# Patient Record
Sex: Male | Born: 1950 | Race: Black or African American | Hispanic: No | Marital: Married | Smoking: Never smoker
Health system: Southern US, Community
[De-identification: ages and names within clinical notes are randomized; demographics above are authoritative.]

## PROBLEM LIST (undated history)

## (undated) DIAGNOSIS — I10 Essential (primary) hypertension: Secondary | ICD-10-CM

## (undated) DIAGNOSIS — M1990 Unspecified osteoarthritis, unspecified site: Secondary | ICD-10-CM

## (undated) DIAGNOSIS — R7303 Prediabetes: Secondary | ICD-10-CM

---

## 1988-07-13 HISTORY — PX: TONSILLECTOMY: SUR1361

## 2021-03-21 ENCOUNTER — Ambulatory Visit (INDEPENDENT_AMBULATORY_CARE_PROVIDER_SITE_OTHER): Payer: BC Managed Care – PPO | Admitting: Orthopaedic Surgery

## 2021-03-21 ENCOUNTER — Other Ambulatory Visit: Payer: Self-pay

## 2021-03-21 ENCOUNTER — Other Ambulatory Visit (HOSPITAL_BASED_OUTPATIENT_CLINIC_OR_DEPARTMENT_OTHER): Payer: Self-pay | Admitting: Orthopaedic Surgery

## 2021-03-21 ENCOUNTER — Ambulatory Visit (HOSPITAL_BASED_OUTPATIENT_CLINIC_OR_DEPARTMENT_OTHER)
Admission: RE | Admit: 2021-03-21 | Discharge: 2021-03-21 | Disposition: A | Discharge status: Home / Self Care | Payer: BC Managed Care – PPO | Source: Ambulatory Visit | Attending: Orthopaedic Surgery | Admitting: Orthopaedic Surgery

## 2021-03-21 ENCOUNTER — Ambulatory Visit (HOSPITAL_BASED_OUTPATIENT_CLINIC_OR_DEPARTMENT_OTHER): Payer: Self-pay | Admitting: Orthopaedic Surgery

## 2021-03-21 DIAGNOSIS — R52 Pain, unspecified: Secondary | ICD-10-CM | POA: Diagnosis present

## 2021-03-21 DIAGNOSIS — G8929 Other chronic pain: Secondary | ICD-10-CM | POA: Diagnosis present

## 2021-03-21 DIAGNOSIS — M12811 Other specific arthropathies, not elsewhere classified, right shoulder: Secondary | ICD-10-CM | POA: Diagnosis not present

## 2021-03-21 DIAGNOSIS — M25511 Pain in right shoulder: Secondary | ICD-10-CM

## 2021-03-21 NOTE — Progress Notes (Addendum)
Chief Complaint: Right shoulder pain     History of Present Illness:   Pain Score: 8/10 SANE: 10/100  Dwayne Wade is a 70 y.o. male with right shoulder pain now ongoing for greater than 5 years.  He states that the pain is occurred insidiously and is not associated with any specific injury or accident.  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  He has had multiple nonoperative treatments including anti-inflammatory medications.  He is on tramadol as well as oxycodone as result of his significant shoulder pain.  He attempts to be very active and work out although this is severely limited due to his shoulder pain and dysfunction.  He has had multiple injections but they estimate is 5 total injections which are no longer efficacious.  His last injection was earlier in 2022 which provided essentially no pain relief.  He is here today to discuss further treatment and evaluation.  At this time, he has failed greater than 6 weeks of conservative nonoperative treatments including NSAID medication, activity modification, and steroid injections.  He has previously trialed physical therapy although this did not significantly reduce his pain.    Surgical History:   None  PMH/PSH/Family History/Social History/Meds/Allergies:   No past medical history on file.  Social History   Socioeconomic History   Marital status: Married    Spouse name: Not on file   Number of children: Not on file   Years of education: Not on file   Highest education level: Not on file  Occupational History   Not on file  Tobacco Use   Smoking status: Not on file   Smokeless tobacco: Not on file  Substance and Sexual Activity   Alcohol use: Not on file   Drug use: Not on file   Sexual activity: Not on file  Other Topics Concern   Not on file  Social History Narrative   Not on file   Social Determinants of Health   Financial Resource Strain: Not on  file  Food Insecurity: Not on file  Transportation Needs: Not on file  Physical Activity: Not on file  Stress: Not on file  Social Connections: Not on file   No family history on file. Not on File No current outpatient medications on file.   No current facility-administered medications for this visit.   No results found.  Review of Systems:   A ROS was performed including pertinent positives and negatives as documented in the HPI.  Physical Exam :   Constitutional: NAD and appears stated age Neurological: Alert and oriented Psych: Appropriate affect and cooperative There were no vitals taken for this visit.   Comprehensive Musculoskeletal Exam:    Musculoskeletal Exam    Inspection Right Left  Skin No atrophy or winging No atrophy or winging  Palpation    Tenderness Glenohumeral None  Range of Motion    Flexion (passive) 60 170  Flexion (active) 180  Achiness 70-year-old male here with his 180  Extension 30 30  Abduction 20 170  ER at the side 0 70  ER at 90 of abduction Not tested 90  IR at 90 of abduction Not tested 60  Can reach behind back to Sacrum pain T10  Strength     4 out of 5 due to pain Normal  Special Tests    Pseudoparalytic No No  Neurologic    Fires PIN, radial, median, ulnar, musculocutaneous, axillary, suprascapular, long thoracic, and spinal accessory innervated muscles. No abnormal sensibility  Vascular/Lymphatic    Radial Pulse 2+ 2+  Cervical Exam    Patient has symmetric cervical range of motion with negative Spurling's test.  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   Assessment:   70 year old right-hand-dominant male-dominant male with advanced rotator cuff arthropathy.  At this time he has exhausted all nonoperative treatment modalities including shoe right  steroid shoulder injections and medical management.  He is wishing to pursue right shoulder arthroplasty in order to improve his pain and his function.  I have advised that given his age in the setting of humeral head migration that I believe a reverse shoulder arthroplasty would be the ideal procedure for him.  We will plan to perform a CT scan for preoperative planning.    At this time, he has failed greater than 6 weeks of conservative nonoperative treatments including NSAID medication, activity modification, and steroid injections.  He has previously trialed physical therapy although this did not significantly reduce his pain. Plan :    -CT scan right shoulder for preoperative assessment -Plan for right shoulder reverse arthroplasty on an elective basis    After a lengthy discussion of treatment options, including risks, benefits, alternatives, complications of surgical and nonsurgical conservative options, the patient elected surgical repair.   The patient  is aware of the material risks  and complications including, but not limited to injury to adjacent structures, neurovascular injury, infection, numbness, bleeding, implant failure, thermal burns, stiffness, persistent pain, failure to heal, disease transmission from allograft, need for further surgery, dislocation, anesthetic risks, blood clots, risks of death,and others. The probabilities of surgical success and failure discussed with patient given their particular co-morbidities.The time and nature of expected rehabilitation and recovery was discussed.The patient's questions were all answered preoperatively.  No barriers to understanding were noted. I explained the natural history of the disease process and Rx rationale.  I explained to the patient what I considered to be reasonable expectations given their personal situation.  The final treatment plan was arrived at through a shared patient decision making process model.  Patient was  prescribed a shoulder immobilizer today for the diagnosis listed above under assessment. The patient requires stabilization from this orthosis in their right arm.  This will be provided in the operating room     I personally saw and evaluated the patient, and participated in the management and treatment plan.  Huel Cote, MD Attending Physician, Orthopedic Surgery  This document was dictated using Dragon voice recognition software. A reasonable attempt at proof reading has been made to minimize errors.'  Is a patient is

## 2021-03-31 ENCOUNTER — Other Ambulatory Visit: Payer: Self-pay

## 2021-04-18 NOTE — Progress Notes (Signed)
Surgical Instructions    Your procedure is scheduled on 04/30/21.  Report to [facility] Main Entrance "A" at 10:15 A.M., then check in with the Admitting office.  Call this number if you have problems the morning of surgery:  [phone]   If you have any questions prior to your surgery date call [phone]: Open Monday-Friday 8am-4pm    Remember:  Do not eat after midnight the night before your surgery  You may drink clear liquids until 09:15am the morning of your surgery.   Clear liquids allowed are: Water, Non-Citrus Juices (without pulp), Carbonated Beverages, Clear Tea, Black Coffee ONLY (NO MILK, CREAM OR POWDERED CREAMER of any kind), and Gatorade  Patient Instructions  The night before surgery:  No food after midnight. ONLY clear liquids after midnight  The day of surgery (if you do NOT have diabetes):  Drink ONE (1) Pre-Surgery Clear Ensure by 09:15am the morning of surgery. Drink in one sitting. Do not sip.  This drink was given to you during your hospital  pre-op appointment visit. Nothing else to drink after completing the  Pre-Surgery Clear Ensure.           If you have questions, please contact your surgeon's office.     Take these medicines the morning of surgery with A SIP OF WATER  amLODipine (NORVASC)  PROLATE  traMADol (ULTRAM)   IF NEEDED: hydrOXYzine (ATARAX/VISTARIL) meclizine (ANTIVERT) montelukast (SINGULAIR)   As of today, STOP taking any Aspirin (unless otherwise instructed by your surgeon) Aleve, Naproxen, Ibuprofen, Motrin, Advil, Goody's, BC's, all herbal medications, fish oil, and all vitamins.     After your COVID test   You are not required to quarantine however you are required to wear a well-fitting mask when you are out and around people not in your household.  If your mask becomes wet or soiled, replace with a new one.  Wash your hands often with soap and water for 20 seconds or clean your hands with an alcohol-based hand  sanitizer that contains at least 60% alcohol.  Do not share personal items.  Notify your provider: if you are in close contact with someone who has COVID  or if you develop a fever of 100.4 or greater, sneezing, cough, sore throat, shortness of breath or body aches.             Do not wear jewelry or makeup Do not wear lotions, powders, perfumes/colognes, or deodorant. Men may shave face and neck. Do not bring valuables to the hospital.  DO Not wear nail polish, gel polish, artificial nails, or any other type of covering on natural nails including finger and toenails. If patients have artificial nails, gel coating, etc. that need to be removed by a nail salon, please have this removed prior to surgery or surgery may need to be canceled/delayed if the surgeon/ anesthesia feels like the patient is unable to be adequately monitored.             [facility] is not responsible for any belongings or valuables.  Do NOT Smoke (Tobacco/Vaping)  24 hours prior to your procedure  If you use a CPAP at night, you may bring your mask for your overnight stay.   Contacts, glasses, hearing aids, dentures or partials may not be worn into surgery, please bring cases for these belongings   For patients admitted to the hospital, discharge time will be determined by your treatment team.   Patients discharged the day of surgery will not be  allowed to drive home, and someone needs to stay with them for 24 hours.  NO VISITORS WILL BE ALLOWED IN PRE-OP WHERE PATIENTS ARE PREPPED FOR SURGERY.  ONLY 1 SUPPORT PERSON MAY BE PRESENT IN THE WAITING ROOM WHILE YOU ARE IN SURGERY.  IF YOU ARE TO BE ADMITTED, ONCE YOU ARE IN YOUR ROOM YOU WILL BE ALLOWED TWO (2) VISITORS. 1 (ONE) VISITOR MAY STAY OVERNIGHT BUT MUST ARRIVE TO THE ROOM BY 8pm.  Minor children may have two parents present. Special consideration for safety and communication needs will be reviewed on a case by case basis.  Special instructions:    Oral  Hygiene is also important to reduce your risk of infection.  Remember - BRUSH YOUR TEETH THE MORNING OF SURGERY WITH YOUR REGULAR TOOTHPASTE      [facility]- Preparing for Total Shoulder Arthroplasty  Before surgery, you can play an important role. Because skin is not sterile, your skin needs to be as free of germs as possible. You can reduce the number of germs on your skin by using the following products.   Benzoyl Peroxide Gel  o Reduces the number of germs present on the skin  o Applied twice a day to shoulder area starting two days before surgery   Chlorhexidine Gluconate (CHG) Soap (instructions listed above on how to wash with CHG Soap)  o An antiseptic cleaner that kills germs and bonds with the skin to continue killing germs even after washing  o Used for showering the night before surgery and morning of surgery   ==================================================================  Please follow these instructions carefully:  BENZOYL PEROXIDE 5% GEL  Please do not use if you have an allergy to benzoyl peroxide. If your skin becomes reddened/irritated stop using the benzoyl peroxide.  Starting two days before surgery, apply as follows:  1. Apply benzoyl peroxide in the morning and at night. Apply after taking a shower. If you are not taking a shower clean entire shoulder front, back, and side along with the armpit with a clean wet washcloth.  2. Place a quarter-sized dollop on your SHOULDER and rub in thoroughly, making sure to cover the front, back, and side of your shoulder, along with the armpit.   2 Days prior to Surgery First Dose on 04/28/21 Morning Second Dose on 04/28/21 Night  Day Before Surgery First Dose on 04/29/21 Morning Night before surgery wash (entire body except face and private areas) with CHG Soap THEN Second Dose on 04/29/21 Night   Morning of Surgery  wash BODY AGAIN with CHG Soap   4. Do NOT apply benzoyl peroxide gel on the day of  surgery   [facility]- Preparing For Surgery  Before surgery, you can play an important role. Because skin is not sterile, your skin needs to be as free of germs as possible. You can reduce the number of germs on your skin by washing with CHG (chlorahexidine gluconate) Soap before surgery.  CHG is an antiseptic cleaner which kills germs and bonds with the skin to continue killing germs even after washing.     Please do not use if you have an allergy to CHG or antibacterial soaps. If your skin becomes reddened/irritated stop using the CHG.  Do not shave (including legs and underarms) for at least 48 hours prior to first CHG shower. It is OK to shave your face.  Please follow these instructions carefully.     Shower the NIGHT BEFORE SURGERY and the MORNING OF SURGERY with CHG Soap.  If you chose to wash your hair, wash your hair first as usual with your normal shampoo. After you shampoo, rinse your hair and body thoroughly to remove the shampoo.  Then [employer] and genitals (private parts) with your normal soap and rinse thoroughly to remove soap.  After that Use CHG Soap as you would any other liquid soap. You can apply CHG directly to the skin and wash gently with a scrungie or a clean washcloth.   Apply the CHG Soap to your body ONLY FROM THE NECK DOWN.  Do not use on open wounds or open sores. Avoid contact with your eyes, ears, mouth and genitals (private parts). Wash Face and genitals (private parts)  with your normal soap.   Wash thoroughly, paying special attention to the area where your surgery will be performed.  Thoroughly rinse your body with warm water from the neck down.  DO NOT shower/wash with your normal soap after using and rinsing off the CHG Soap.  Pat yourself dry with a CLEAN TOWEL.  8. Apply the Benzoyl Peroxide only the night before surgery.  Do Not use it the morning of surgery.  Wear CLEAN PAJAMAS to bed the night before surgery  Place CLEAN SHEETS on your  bed the night before your surgery  DO NOT SLEEP WITH PETS.   Day of Surgery: Take a shower with CHG soap. Wear Clean/Comfortable clothing the morning of surgery Do not apply any deodorants/lotions.   Remember to brush your teeth WITH YOUR REGULAR TOOTHPASTE.   Please read over the following fact sheets that you were given.

## 2021-04-21 ENCOUNTER — Inpatient Hospital Stay (HOSPITAL_COMMUNITY)
Admission: RE | Admit: 2021-04-21 | Discharge: 2021-04-21 | Disposition: A | Discharge status: Home / Self Care | Payer: BC Managed Care – PPO | Source: Ambulatory Visit

## 2021-04-21 NOTE — Progress Notes (Signed)
Called patient at 14 and spoke to his wife.  I asked if he was coming to his 0800 appt?  She said is it to late to come now?  I said for the 8am appt yes but I have a 3pm appt time and that all the other appt times were full for me today.  I offered the 3pm to her twice and then saw that surgery was not until the 19th.  I then told her I would have our scheduler Dondra Spry call them.

## 2021-04-22 ENCOUNTER — Other Ambulatory Visit: Payer: Self-pay

## 2021-04-22 ENCOUNTER — Encounter (HOSPITAL_COMMUNITY)
Admission: RE | Admit: 2021-04-22 | Discharge: 2021-04-22 | Disposition: A | Discharge status: Home / Self Care | Payer: BC Managed Care – PPO | Source: Ambulatory Visit | Attending: Orthopaedic Surgery | Admitting: Orthopaedic Surgery

## 2021-04-22 ENCOUNTER — Encounter (HOSPITAL_COMMUNITY): Payer: Self-pay

## 2021-04-22 ENCOUNTER — Telehealth: Payer: Self-pay | Admitting: Orthopaedic Surgery

## 2021-04-22 DIAGNOSIS — Z01818 Encounter for other preprocedural examination: Secondary | ICD-10-CM | POA: Diagnosis present

## 2021-04-22 HISTORY — DX: Prediabetes: R73.03

## 2021-04-22 HISTORY — DX: Essential (primary) hypertension: I10

## 2021-04-22 HISTORY — DX: Unspecified osteoarthritis, unspecified site: M19.90

## 2021-04-22 LAB — CBC WITH DIFFERENTIAL/PLATELET
Abs Immature Granulocytes: 0.01 10*3/uL (ref 0.00–0.07)
Basophils Absolute: 0 10*3/uL (ref 0.0–0.1)
Basophils Relative: 1 %
Eosinophils Absolute: 0.3 10*3/uL (ref 0.0–0.5)
Eosinophils Relative: 6 %
HCT: 36.6 % — ABNORMAL LOW (ref 39.0–52.0)
Hemoglobin: 12.6 g/dL — ABNORMAL LOW (ref 13.0–17.0)
Immature Granulocytes: 0 %
Lymphocytes Relative: 23 %
Lymphs Abs: 1.2 10*3/uL (ref 0.7–4.0)
MCH: 30.9 pg (ref 26.0–34.0)
MCHC: 34.4 g/dL (ref 30.0–36.0)
MCV: 89.7 fL (ref 80.0–100.0)
Monocytes Absolute: 0.6 10*3/uL (ref 0.1–1.0)
Monocytes Relative: 13 %
Neutro Abs: 2.8 10*3/uL (ref 1.7–7.7)
Neutrophils Relative %: 57 %
Platelets: 200 10*3/uL (ref 150–400)
RBC: 4.08 MIL/uL — ABNORMAL LOW (ref 4.22–5.81)
RDW: 13.2 % (ref 11.5–15.5)
WBC: 4.9 10*3/uL (ref 4.0–10.5)
nRBC: 0 % (ref 0.0–0.2)

## 2021-04-22 LAB — BASIC METABOLIC PANEL
Anion gap: 9 (ref 5–15)
BUN: 22 mg/dL (ref 8–23)
CO2: 24 mmol/L (ref 22–32)
Calcium: 9.2 mg/dL (ref 8.9–10.3)
Chloride: 102 mmol/L (ref 98–111)
Creatinine, Ser: 1.52 mg/dL — ABNORMAL HIGH (ref 0.61–1.24)
GFR, Estimated: 49 mL/min — ABNORMAL LOW (ref >60)
Glucose, Bld: 105 mg/dL — ABNORMAL HIGH (ref 70–99)
Potassium: 3.5 mmol/L (ref 3.5–5.1)
Sodium: 135 mmol/L (ref 135–145)

## 2021-04-22 LAB — SURGICAL PCR SCREEN
MRSA, PCR: POSITIVE — AB
Staphylococcus aureus: POSITIVE — AB

## 2021-04-22 LAB — GLUCOSE, CAPILLARY: Glucose-Capillary: 104 mg/dL — ABNORMAL HIGH (ref 70–99)

## 2021-04-22 LAB — PROTIME-INR
INR: 0.9 (ref 0.8–1.2)
Prothrombin Time: 12.1 seconds (ref 11.4–15.2)

## 2021-04-22 NOTE — Progress Notes (Signed)
PCP - Chiquita Loth, PA Cardiologist - denies  PPM/ICD - denies   Chest x-ray - denies EKG - denies Stress Test - denies ECHO - denies Cardiac Cath - denies  Sleep Study - denies   DM- Pre-diabetic, pt does not check CBG at home  Blood Thinner Instructions: n/a Aspirin Instructions: n/a  ERAS Protcol - yes PRE-SURGERY Ensure given  COVID TEST- n/a, ambulatory surgery   Anesthesia review: No  Patient denies shortness of breath, fever, cough and chest pain at PAT appointment   All instructions explained to the patient, with a verbal understanding of the material. Patient agrees to go over the instructions while at home for a better understanding. The opportunity to ask questions was provided.

## 2021-04-22 NOTE — Telephone Encounter (Signed)
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   Patient is scheduled for right reverse shoulder arthroplasty with Dr. Steward Drone at [facility] 04-30-21 at 12:15pm.

## 2021-04-25 ENCOUNTER — Ambulatory Visit (HOSPITAL_BASED_OUTPATIENT_CLINIC_OR_DEPARTMENT_OTHER): Payer: BC Managed Care – PPO | Admitting: Orthopaedic Surgery

## 2021-04-29 NOTE — Anesthesia Preprocedure Evaluation (Addendum)
Anesthesia Evaluation  Patient identified by MRN, date of birth, ID band Patient awake    Reviewed: Allergy & Precautions, NPO status , Patient's Chart, lab work & pertinent test results  Airway Mallampati: II  TM Distance: >3 FB Neck ROM: Full    Dental no notable dental hx.    Pulmonary neg pulmonary ROS,    Pulmonary exam normal breath sounds clear to auscultation       Cardiovascular hypertension, Pt. on medications Normal cardiovascular exam Rhythm:Regular Rate:Normal  ECG: NSR, rate 66   Neuro/Psych negative neurological ROS  negative psych ROS   GI/Hepatic negative GI ROS, (+)     substance abuse  ,   Endo/Other  Pre-DM  Renal/GU Renal InsufficiencyRenal disease     Musculoskeletal  (+) Arthritis , narcotic dependent  Abdominal   Peds  Hematology  (+) anemia , HLD   Anesthesia Other Findings Right Rotator Cuff Arthropathy  Reproductive/Obstetrics                            Anesthesia Physical Anesthesia Plan  ASA: 2  Anesthesia Plan: General and Regional   Post-op Pain Management:    Induction: Intravenous  PONV Risk Score and Plan: 2 and Ondansetron, Dexamethasone and Treatment may vary due to age or medical condition  Airway Management Planned: Oral ETT  Additional Equipment:   Intra-op Plan:   Post-operative Plan: Extubation in OR  Informed Consent: I have reviewed the patients History and Physical, chart, labs and discussed the procedure including the risks, benefits and alternatives for the proposed anesthesia with the patient or authorized representative who has indicated his/her understanding and acceptance.     Dental advisory given  Plan Discussed with: CRNA  Anesthesia Plan Comments:        Anesthesia Quick Evaluation

## 2021-04-30 ENCOUNTER — Other Ambulatory Visit (HOSPITAL_BASED_OUTPATIENT_CLINIC_OR_DEPARTMENT_OTHER): Payer: Self-pay | Admitting: Orthopaedic Surgery

## 2021-04-30 ENCOUNTER — Ambulatory Visit (HOSPITAL_COMMUNITY): Payer: BC Managed Care – PPO | Admitting: Anesthesiology

## 2021-04-30 ENCOUNTER — Other Ambulatory Visit: Payer: Self-pay

## 2021-04-30 ENCOUNTER — Encounter (HOSPITAL_COMMUNITY)
Admission: RE | Disposition: A | Discharge status: Home / Self Care | Payer: Self-pay | Source: Home / Self Care | Attending: Orthopaedic Surgery

## 2021-04-30 ENCOUNTER — Ambulatory Visit (HOSPITAL_COMMUNITY)
Admission: RE | Admit: 2021-04-30 | Discharge: 2021-04-30 | Disposition: A | Discharge status: Home / Self Care | Payer: BC Managed Care – PPO | Attending: Orthopaedic Surgery | Admitting: Orthopaedic Surgery

## 2021-04-30 ENCOUNTER — Encounter (HOSPITAL_COMMUNITY): Payer: Self-pay | Admitting: Orthopaedic Surgery

## 2021-04-30 ENCOUNTER — Ambulatory Visit (HOSPITAL_COMMUNITY): Payer: BC Managed Care – PPO

## 2021-04-30 DIAGNOSIS — M25711 Osteophyte, right shoulder: Secondary | ICD-10-CM | POA: Diagnosis not present

## 2021-04-30 DIAGNOSIS — M19011 Primary osteoarthritis, right shoulder: Secondary | ICD-10-CM | POA: Diagnosis not present

## 2021-04-30 DIAGNOSIS — Z96612 Presence of left artificial shoulder joint: Secondary | ICD-10-CM

## 2021-04-30 DIAGNOSIS — M25511 Pain in right shoulder: Secondary | ICD-10-CM | POA: Diagnosis present

## 2021-04-30 DIAGNOSIS — M12811 Other specific arthropathies, not elsewhere classified, right shoulder: Secondary | ICD-10-CM

## 2021-04-30 DIAGNOSIS — M129 Arthropathy, unspecified: Secondary | ICD-10-CM | POA: Diagnosis not present

## 2021-04-30 DIAGNOSIS — Z79891 Long term (current) use of opiate analgesic: Secondary | ICD-10-CM | POA: Insufficient documentation

## 2021-04-30 DIAGNOSIS — M7521 Bicipital tendinitis, right shoulder: Secondary | ICD-10-CM | POA: Insufficient documentation

## 2021-04-30 HISTORY — PX: REVERSE SHOULDER ARTHROPLASTY: SHX5054

## 2021-04-30 LAB — GLUCOSE, CAPILLARY
Glucose-Capillary: 176 mg/dL — ABNORMAL HIGH (ref 70–99)
Glucose-Capillary: 108 mg/dL — ABNORMAL HIGH (ref 70–99)

## 2021-04-30 SURGERY — ARTHROPLASTY, SHOULDER, TOTAL, REVERSE
Anesthesia: Regional | Site: Shoulder | Laterality: Right

## 2021-04-30 MED ORDER — DEXAMETHASONE SODIUM PHOSPHATE 10 MG/ML IJ SOLN
INTRAMUSCULAR | Status: DC | PRN
  Administered 2021-04-30: 8 mg via INTRAVENOUS

## 2021-04-30 MED ORDER — ROCURONIUM BROMIDE 10 MG/ML (PF) SYRINGE
PREFILLED_SYRINGE | INTRAVENOUS | Status: DC | PRN
  Administered 2021-04-30: 30 mg via INTRAVENOUS
  Administered 2021-04-30: 10 mg via INTRAVENOUS
  Administered 2021-04-30: 60 mg via INTRAVENOUS

## 2021-04-30 MED ORDER — ROCURONIUM BROMIDE 10 MG/ML (PF) SYRINGE
PREFILLED_SYRINGE | INTRAVENOUS | Status: AC
  Filled 2021-04-30: qty 10

## 2021-04-30 MED ORDER — ONDANSETRON HCL 4 MG/2ML IJ SOLN
INTRAMUSCULAR | Status: DC | PRN
  Administered 2021-04-30: 4 mg via INTRAVENOUS

## 2021-04-30 MED ORDER — ONDANSETRON HCL 4 MG/2ML IJ SOLN: 4.0000 mg | Freq: Once | INTRAMUSCULAR | Status: DC | PRN

## 2021-04-30 MED ORDER — ASPIRIN EC 325 MG PO TBEC: 325.0000 mg | DELAYED_RELEASE_TABLET | Freq: Every day | ORAL | 0 refills | Status: AC

## 2021-04-30 MED ORDER — EPHEDRINE 5 MG/ML INJ
INTRAVENOUS | Status: AC
  Filled 2021-04-30: qty 5

## 2021-04-30 MED ORDER — LACTATED RINGERS IV SOLN: INTRAVENOUS | Status: DC

## 2021-04-30 MED ORDER — LIDOCAINE 2% (20 MG/ML) 5 ML SYRINGE
INTRAMUSCULAR | Status: AC
  Filled 2021-04-30: qty 5

## 2021-04-30 MED ORDER — DEXAMETHASONE SODIUM PHOSPHATE 10 MG/ML IJ SOLN
INTRAMUSCULAR | Status: AC
  Filled 2021-04-30: qty 1

## 2021-04-30 MED ORDER — FENTANYL CITRATE (PF) 100 MCG/2ML IJ SOLN: 25.0000 ug | INTRAMUSCULAR | Status: DC | PRN

## 2021-04-30 MED ORDER — OXYCODONE HCL 5 MG PO CAPS: 5.0000 mg | ORAL_CAPSULE | ORAL | 0 refills | Status: AC | PRN

## 2021-04-30 MED ORDER — CHLORHEXIDINE GLUCONATE 0.12 % MT SOLN
15.0000 mL | Freq: Once | OROMUCOSAL | Status: AC
  Administered 2021-04-30: 15 mL via OROMUCOSAL
  Filled 2021-04-30: qty 15

## 2021-04-30 MED ORDER — ACETAMINOPHEN 500 MG PO TABS
1000.0000 mg | ORAL_TABLET | Freq: Once | ORAL | Status: AC
  Administered 2021-04-30: 1000 mg via ORAL
  Filled 2021-04-30: qty 2

## 2021-04-30 MED ORDER — AMISULPRIDE (ANTIEMETIC) 5 MG/2ML IV SOLN: 5.0000 mg | Freq: Once | INTRAVENOUS | Status: DC | PRN

## 2021-04-30 MED ORDER — FENTANYL CITRATE (PF) 250 MCG/5ML IJ SOLN
INTRAMUSCULAR | Status: AC
  Filled 2021-04-30: qty 5

## 2021-04-30 MED ORDER — DEXMEDETOMIDINE (PRECEDEX) IN NS 20 MCG/5ML (4 MCG/ML) IV SYRINGE
PREFILLED_SYRINGE | INTRAVENOUS | Status: AC
  Filled 2021-04-30: qty 5

## 2021-04-30 MED ORDER — ACETAMINOPHEN 500 MG PO TABS: 500.0000 mg | ORAL_TABLET | Freq: Three times a day (TID) | ORAL | 0 refills | Status: AC

## 2021-04-30 MED ORDER — VANCOMYCIN HCL IN DEXTROSE 1-5 GM/200ML-% IV SOLN
1000.0000 mg | INTRAVENOUS | Status: AC
  Administered 2021-04-30: 1000 mg via INTRAVENOUS
  Filled 2021-04-30: qty 200

## 2021-04-30 MED ORDER — MIDAZOLAM HCL 2 MG/2ML IJ SOLN
INTRAMUSCULAR | Status: AC
  Filled 2021-04-30: qty 2

## 2021-04-30 MED ORDER — TRANEXAMIC ACID-NACL 1000-0.7 MG/100ML-% IV SOLN
1000.0000 mg | INTRAVENOUS | Status: AC
  Administered 2021-04-30: 1000 mg via INTRAVENOUS
  Filled 2021-04-30: qty 100

## 2021-04-30 MED ORDER — ONDANSETRON HCL 4 MG/2ML IJ SOLN
INTRAMUSCULAR | Status: AC
  Filled 2021-04-30: qty 2

## 2021-04-30 MED ORDER — GABAPENTIN 300 MG PO CAPS
300.0000 mg | ORAL_CAPSULE | Freq: Once | ORAL | Status: AC
  Administered 2021-04-30: 300 mg via ORAL
  Filled 2021-04-30: qty 1

## 2021-04-30 MED ORDER — 0.9 % SODIUM CHLORIDE (POUR BTL) OPTIME
TOPICAL | Status: DC | PRN
  Administered 2021-04-30: 1000 mL

## 2021-04-30 MED ORDER — BUPIVACAINE LIPOSOME 1.3 % IJ SUSP
INTRAMUSCULAR | Status: DC | PRN
  Administered 2021-04-30: 10 mL via PERINEURAL

## 2021-04-30 MED ORDER — PROPOFOL 10 MG/ML IV BOLUS
INTRAVENOUS | Status: AC
  Filled 2021-04-30: qty 20

## 2021-04-30 MED ORDER — PROPOFOL 10 MG/ML IV BOLUS
INTRAVENOUS | Status: AC
  Filled 2021-04-30: qty 40

## 2021-04-30 MED ORDER — ORAL CARE MOUTH RINSE: 15.0000 mL | Freq: Once | OROMUCOSAL | Status: AC

## 2021-04-30 MED ORDER — LIDOCAINE 2% (20 MG/ML) 5 ML SYRINGE
INTRAMUSCULAR | Status: DC | PRN
  Administered 2021-04-30: 40 mg via INTRAVENOUS

## 2021-04-30 MED ORDER — PROPOFOL 10 MG/ML IV BOLUS
INTRAVENOUS | Status: DC | PRN
  Administered 2021-04-30: 100 mg via INTRAVENOUS

## 2021-04-30 MED ORDER — CEFAZOLIN SODIUM-DEXTROSE 2-4 GM/100ML-% IV SOLN
2.0000 g | INTRAVENOUS | Status: AC
  Administered 2021-04-30: 2 g via INTRAVENOUS
  Filled 2021-04-30: qty 100

## 2021-04-30 MED ORDER — VANCOMYCIN HCL 500 MG IV SOLR
INTRAVENOUS | Status: DC | PRN
  Administered 2021-04-30: 500 mg via TOPICAL

## 2021-04-30 MED ORDER — STERILE WATER FOR IRRIGATION IR SOLN
Status: DC | PRN
  Administered 2021-04-30: 2000 mL

## 2021-04-30 MED ORDER — VANCOMYCIN HCL 500 MG IV SOLR
INTRAVENOUS | Status: AC
  Filled 2021-04-30: qty 10

## 2021-04-30 MED ORDER — FENTANYL CITRATE (PF) 250 MCG/5ML IJ SOLN
INTRAMUSCULAR | Status: DC | PRN
  Administered 2021-04-30: 100 ug via INTRAVENOUS

## 2021-04-30 MED ORDER — SODIUM CHLORIDE 0.9 % IR SOLN
Status: DC | PRN
  Administered 2021-04-30: 3000 mL

## 2021-04-30 MED ORDER — PHENYLEPHRINE HCL-NACL 20-0.9 MG/250ML-% IV SOLN
INTRAVENOUS | Status: DC | PRN
  Administered 2021-04-30: 40 ug/min via INTRAVENOUS

## 2021-04-30 MED ORDER — EPHEDRINE SULFATE-NACL 50-0.9 MG/10ML-% IV SOSY
PREFILLED_SYRINGE | INTRAVENOUS | Status: DC | PRN
  Administered 2021-04-30 (×3): 5 mg via INTRAVENOUS

## 2021-04-30 MED ORDER — BUPIVACAINE HCL (PF) 0.5 % IJ SOLN
INTRAMUSCULAR | Status: DC | PRN
  Administered 2021-04-30: 15 mL via PERINEURAL

## 2021-04-30 MED ORDER — SUGAMMADEX SODIUM 200 MG/2ML IV SOLN
INTRAVENOUS | Status: DC | PRN
  Administered 2021-04-30: 308 mg via INTRAVENOUS

## 2021-04-30 SURGICAL SUPPLY — 84 items
APL PRP STRL LF DISP 70% ISPRP (MISCELLANEOUS) ×1
APL SKNCLS STERI-STRIP NONHPOA (GAUZE/BANDAGES/DRESSINGS)
BAG COUNTER SPONGE SURGICOUNT (BAG) ×3 IMPLANT
BAG SPNG CNTER NS LX DISP (BAG) ×1
BENZOIN TINCTURE PRP APPL 2/3 (GAUZE/BANDAGES/DRESSINGS) ×2 IMPLANT
BIT DRILL ERGO 3.2 STRL (BIT) ×1 IMPLANT
BIT DRILL ERGO GPS RS 3.2 (DRILL) IMPLANT
BLADE SAW SAG 29X58X.64 (BLADE) ×3 IMPLANT
BLADE SAW SGTL 83.5X18.5 (BLADE) IMPLANT
BLADE SURG 15 STRL LF DISP TIS (BLADE) ×4 IMPLANT
BLADE SURG 15 STRL SS (BLADE) ×6
CAP LOCK EXT GLENOID RS 38 +4 (Cap) ×1 IMPLANT
CHLORAPREP W/TINT 26 (MISCELLANEOUS) ×3 IMPLANT
COVER BACK TABLE 24X17X13 BIG (DRAPES) ×3 IMPLANT
COVER SURGICAL LIGHT HANDLE (MISCELLANEOUS) ×3 IMPLANT
DRAPE IMP U-DRAPE 54X76 (DRAPES) ×3 IMPLANT
DRAPE INCISE IOBAN 66X45 STRL (DRAPES) ×3 IMPLANT
DRAPE ORTHO SPLIT 77X108 STRL (DRAPES) ×4
DRAPE STERI 35X30 U-POUCH (DRAPES) ×3 IMPLANT
DRAPE SURG ORHT 6 SPLT 77X108 (DRAPES) ×4 IMPLANT
DRAPE U-SHAPE 47X51 STRL (DRAPES) ×4 IMPLANT
DRILL ERGO GPS RS 3.2 (DRILL) ×2
DRSG AQUACEL AG ADV 3.5X10 (GAUZE/BANDAGES/DRESSINGS) ×1 IMPLANT
DRSG PAD ABDOMINAL 8X10 ST (GAUZE/BANDAGES/DRESSINGS) ×2 IMPLANT
ELECT REM PT RETURN 9FT ADLT (ELECTROSURGICAL) ×2
ELECTRODE REM PT RTRN 9FT ADLT (ELECTROSURGICAL) ×2 IMPLANT
GAUZE SPONGE 4X4 12PLY STRL (GAUZE/BANDAGES/DRESSINGS) ×2 IMPLANT
GAUZE XEROFORM 1X8 LF (GAUZE/BANDAGES/DRESSINGS) ×2 IMPLANT
GLENOSPHERE RSA EQUINOXE W/CAP (Joint) ×1 IMPLANT
GLOVE SRG 8 PF TXTR STRL LF DI (GLOVE) ×2 IMPLANT
GLOVE SURG LTX SZ8 (GLOVE) ×3 IMPLANT
GLOVE SURG POLY ORTHO LF SZ8 (GLOVE) ×3 IMPLANT
GLOVE SURG POLYISO LF SZ7.5 (GLOVE) ×3 IMPLANT
GLOVE SURG UNDER POLY LF SZ8 (GLOVE) ×2
GOWN STRL REUS W/ TWL LRG LVL3 (GOWN DISPOSABLE) ×4 IMPLANT
GOWN STRL REUS W/TWL LRG LVL3 (GOWN DISPOSABLE) ×4
HANDPIECE INTERPULSE COAX TIP (DISPOSABLE) ×2
K-WIRE ERGO 3.2 STRL (WIRE) ×2
K-WIRE SURGICAL 1.6X102 (WIRE) ×1 IMPLANT
KIT BASIN OR (CUSTOM PROCEDURE TRAY) ×3 IMPLANT
KIT GPS RS EQUIN USER (KITS) ×1 IMPLANT
KIT TURNOVER KIT B (KITS) ×3 IMPLANT
KWIRE ERGO 3.2 STRL (WIRE) IMPLANT
LINER HUM RS EQUIN 38 +0 (Liner) ×2 IMPLANT
MANIFOLD NEPTUNE II (INSTRUMENTS) ×3 IMPLANT
NDL HYPO 25GX1X1/2 BEV (NEEDLE) ×2 IMPLANT
NDL SUT 6 .5 CRC .975X.05 MAYO (NEEDLE) IMPLANT
NEEDLE HYPO 25GX1X1/2 BEV (NEEDLE) IMPLANT
NEEDLE MAYO TAPER (NEEDLE) ×2
NS IRRIG 1000ML POUR BTL (IV SOLUTION) ×3 IMPLANT
PACK SHOULDER (CUSTOM PROCEDURE TRAY) ×3 IMPLANT
PACK UNIVERSAL I (CUSTOM PROCEDURE TRAY) ×3 IMPLANT
PAD ARMBOARD 7.5X6 YLW CONV (MISCELLANEOUS) ×6 IMPLANT
PIN HEX THRD SHLD GPS +10 (PIN) ×1 IMPLANT
PIN THRD ERGO 2.6 2-PK (PIN) ×1 IMPLANT
PLATE GLENOID SUP RS 10D (Plate) ×1 IMPLANT
PROS ADAPTER HUM RS EQUIN +0 (Orthopedic Implant) ×2 IMPLANT
PROSTHESIS ADPTR HUM RS EQN +0 (Orthopedic Implant) IMPLANT
SCREW COMPR W/LOCK CAP 4.5X26 (Screw) ×1 IMPLANT
SCREW COMPR W/LOCK CAP 4.5X30 (Screw) ×1 IMPLANT
SCREW LOCK COMPR EQ 4.5X22 (Screw) ×2 IMPLANT
SCREW LOCK GLENOID 15-05 (Screw) ×1 IMPLANT
SCREW SHLD RS EQUIN (KITS) ×1 IMPLANT
SET HNDPC FAN SPRY TIP SCT (DISPOSABLE) IMPLANT
SPONGE T-LAP 4X18 ~~LOC~~+RFID (SPONGE) ×6 IMPLANT
STAPLER VISISTAT 35W (STAPLE) ×3 IMPLANT
STEM HUM RS EQUIN 9 (Stem) ×1 IMPLANT
SUCTION FRAZIER HANDLE 10FR (MISCELLANEOUS) ×2
SUCTION TUBE FRAZIER 10FR DISP (MISCELLANEOUS) ×2 IMPLANT
SUT MNCRL AB 3-0 PS2 27 (SUTURE) ×1 IMPLANT
SUT VIC AB 0 CT1 27 (SUTURE) ×2
SUT VIC AB 0 CT1 27XBRD ANBCTR (SUTURE) ×2 IMPLANT
SUT VIC AB 0 CTX 36 (SUTURE) ×4
SUT VIC AB 0 CTX36XBRD ANBCTRL (SUTURE) IMPLANT
SUT VIC AB 0 CTXB 36 (SUTURE) ×3 IMPLANT
SUT VIC AB 2-0 CT1 27 (SUTURE) ×2
SUT VIC AB 2-0 CT1 TAPERPNT 27 (SUTURE) IMPLANT
SYR CONTROL 10ML LL (SYRINGE) ×2 IMPLANT
TOWEL GREEN STERILE (TOWEL DISPOSABLE) ×3 IMPLANT
TOWEL GREEN STERILE FF (TOWEL DISPOSABLE) ×3 IMPLANT
TOWER CARTRIDGE SMART MIX (DISPOSABLE) IMPLANT
TRAY FOLEY MTR SLVR 16FR STAT (SET/KITS/TRAYS/PACK) IMPLANT
WATER STERILE IRR 1000ML POUR (IV SOLUTION) ×3 IMPLANT
YANKAUER SUCT BULB TIP NO VENT (SUCTIONS) ×1 IMPLANT

## 2021-04-30 NOTE — Interval H&P Note (Signed)
History and Physical Interval Note:  04/30/2021 8:04 AM  Dwayne Wade  has presented today for surgery, with the diagnosis of Right Rotator Cuff Arthropathy.  The various methods of treatment have been discussed with the patient and family. After consideration of risks, benefits and other options for treatment, the patient has consented to  Procedure(s): RIGHT REVERSE SHOULDER ARTHROPLASTY (Right) as a surgical intervention.  The patient's history has been reviewed, patient examined, no change in status, stable for surgery.  I have reviewed the patient's chart and labs.  Questions were answered to the patient's satisfaction.     Huel Cote

## 2021-04-30 NOTE — Op Note (Signed)
Date of Surgery: 04/30/2021  INDICATIONS: Mr. Bottger is a 70 y.o.-year-old male with right end stage rotator cuff arthropathy.  The risk and benefits of the procedure with discussed in detail and documented in the pre-operative evaluation.  PREOPERATIVE DIAGNOSIS: 1.  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  Right biceps tendinitis  POSTOPERATIVE DIAGNOSIS: Same.  PROCEDURE: Right reverse shoulder arthroplasty.  SURGEON: Yevonne Pax MD  ASSISTANT: Shirley Friar, ATC; necessary for the timely completion of procedure and due to complexity of procedure.  ANESTHESIA:  general  IV FLUIDS AND URINE: See anesthesia record.  ANTIBIOTICS: Ancef 2 g  ESTIMATED BLOOD LOSS: 25 mL.  IMPLANTS:  Implant Name Type Inv. Item Serial No. Manufacturer Lot No. LRB No. Used Action  Equinox Reverse Shoulder Fixed Angle Torque Defining Screw Kit Kit  [id number] EXACTECH  Right 1 Implanted  Reverse Shoulder Superior Augment Glenoid Plate 10"   [id number] EXACTECH  Right 1 Implanted  Glenosphere Locking Screw   [id number] EXACTECH  Right 1 Implanted  SCREW LOCK COMPR EQ 4.5X22 - [id number] Screw SCREW LOCK COMPR EQ 4.5X22 [id number] EXACTECH  Right 1 Implanted  COMPRESSION SCREW/LOCKING CAP KIT 4.5 X 30 MM/BLUE   [id number] [facility]  Right 1 Implanted  SCREW LOCK COMPR EQ 4.5X22 - [id number] Screw SCREW LOCK COMPR EQ 4.5X22 [id number] EXACTECH  Right 1 Implanted  COMPRESSION SCREW/LOCKING CAP KIT 4.5 X 26 MM/ORANGE   [id number] EXACTECH  Right 1 Implanted  GLENOSPHERE RSA EQUINOXE W/CAP - [id number] Joint GLENOSPHERE RSA EQUINOXE W/CAP [id number] EXACTECH  Right 1 Implanted  GLENOSPHERE & EXTENDED LOCKING CAPS(2) 38MM +4MM OFFSET   [id number] EXACTECH  Right 1 Implanted  HUMERAL STEM PRESS-FIT, PLASMA COATED   [id number] EXACTECH  Right 1 Implanted  EQUINOX REVERSE SHOULDER HUMERAL ADAPTER TRAAY   [id number]   Right 1 Implanted  EQUINOX REVERSE SHOULDER HUMERAL LINER   [id number]   Right 1 Implanted     DRAINS: None  CULTURES: None  COMPLICATIONS: none  DESCRIPTION OF PROCEDURE:  The patient was met in the preoperative holding area.  The patient was identified by her wrist bracelet.   The surgical site was marked by the surgeon.   Once again, the procedure and his postoperative rehabilitation were discussed with the patient and significant others.  Their questions were all answered   The patient was administered inter-scalene block by Anesthesia.  Patient was then taken to the operating room where he was placed in a beach chair position, head and extremities carefully padded.  Spider arm holder was utilized. MAC anesthesia used.   The surgical site was scrubbed with a chlorhexidine scrub brush and alcohol.  The patient was then prepped with chlorhexidine skin prep.Sterile prep and drape were performed.   An pre-operative timeout was called.   The pre-operative antibiotics had been administered prior to skin incision.   The bony landmarks of the shoulder were marked with a marking pen. A delto-pectoral incision was made, extending up approximately 5 inches. The wound with then irrigated with dilute betadine.  Cephalic vein was identified, and an protected. His retracted medially. Subdeltoid and subpectoral lesions were released. Neurovascular structures were carefully protected. The Gelpi retractor was used to retract the deltoid and pectoralis major. A 1 cm release was performed on the upper pectoralis.   The deltoid was retracted laterally with a Brown humeral retractor.  The conjoined tendon was identified.  The cleido-pectoral fascia was excised.  The axillary nerve was palpated and carefully protected throughout the  procedure. The biceps tendon was found and tenodesed to the upper pec with # 2 Vicryl.  Proximally the biceps tendon was removed up to the joint.  The lesser tuberosity subscapularis was tagged with an 0 Vicryl and subsequently peeled from the lesser tuberosity.   Circumferential release around the humeral neck was performed.  There was some osteophyte in the notes.  Areas that were removed with rondure.  There was a very minor amount of infraspinatus that was left intact.  At this point the humeral head was delivered using a lean maneuver.  A neck cut was then made using the pretemplating.  We subsequently broached up to the pretty templated humeral broach size.  The broach protector was then placed.  The humeral head bone was prepared in the back table and subsequently used for the central peg hole of the baseplate.   A 360 Degree release of the subscap and glenoid were done.  Of note due to the chronic pseudoparalysis and loss of motion, there was a significant amount of humeral head release that was needed as well as glenoid release.  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   Glenoid retractors were placed posteriorly, superiorly behind the biceps tendon and anteriorly on the glenoid neck.. A 360-degree release of the capsule was performed with cautery.  The triceps was released off the inferior tubercle of the glenoid. The axillary nerve was carefully protected with the surgeon's index finger, retracting it and using cautery.   A guidepin was placed through the glenoid guide. The guidepin was drilled until it exited the cortex. The guidepin was over drilled. Next, the glenoid was prepared with the reamer  down to cortical bone.  The central peg hole was totally within the scapular neck tested with the probe.  The baseplate was then placed screwed securely with good purchase in position and then with 4 screws. In each case, they were drilled and measured and the appropriate length screw placed with excellent rigid fixation of the baseplate.   At this time the humerus was then redelivered.  The humeral stem was removed.  Thorough irrigation was performed.  We placed the final stem and assembled the  baseplate.  Trial was performed on not a formal reduction performed.  This was just about to be reduced so a full reduction was not performed.  A final polywas impacted into place and the shoulder was reduced.    Appropriate tension was noted on the conjoined tendon and deltoid muscle.  Extension was stable, external and internal rotation as well.    The wound was then irrigated with SurgiLav.  Betadine was added to the irrigation for prevention of infection. Vancomycin powder was placed in the wound again for infection prevention.  Hemostasis was judged to be quite good.  The axillary nerve nerve was checked with a tug test and palpated and was intact.  The wound was then closed in layers with 0 Vicryl interrupted in the deep subcu followed by 2-0 Vicryl in the superficial subcu.  Skin clips were used to close the skin.  Dressing: Aquacel dressing was applied A shoulder immobilizer was applied.     Shirley Friar ATC was necessary for opening, closing, retracting, limb positioning and overall facilitation and timely completion of the procedure.     POSTOPERATIVE PLAN: The patient will be weightbearing as tolerated on the right side and come out of the sling for range of motion immediately with PT. He will sleep  in his sling.  Yevonne Pax, MD 12:57 PM

## 2021-04-30 NOTE — Transfer of Care (Signed)
Immediate Anesthesia Transfer of Care Note  Patient: [name]  Procedure(s) Performed: RIGHT REVERSE SHOULDER ARTHROPLASTY (Right: Shoulder)  Patient Location: PACU  Anesthesia Type:General  Level of Consciousness: sedated  Airway & Oxygen Therapy: Patient Spontanous Breathing and Patient connected to nasal cannula oxygen  Post-op Assessment: Report given to RN and Post -op Vital signs reviewed and stable  Post vital signs: Reviewed and stable  Last Vitals:  Vitals Value Taken Time  BP 122/86 04/30/21 1300  Temp    Pulse 70 04/30/21 1301  Resp 16 04/30/21 1301  SpO2 100 % 04/30/21 1301  Vitals shown include unvalidated device data.  Last Pain:  Vitals:   04/30/21 0708  TempSrc:   PainSc: 6       Patients Stated Pain Goal: 3 (04/30/21 0708)  Complications: No notable events documented.

## 2021-04-30 NOTE — Anesthesia Postprocedure Evaluation (Signed)
Anesthesia Post Note  Patient: Dwayne Wade  Procedure(s) Performed: RIGHT REVERSE SHOULDER ARTHROPLASTY (Right: Shoulder)     Patient location during evaluation: PACU Anesthesia Type: Regional and General Level of consciousness: awake Pain management: pain level controlled Vital Signs Assessment: post-procedure vital signs reviewed and stable Respiratory status: spontaneous breathing, nonlabored ventilation, respiratory function stable and patient connected to nasal cannula oxygen Cardiovascular status: blood pressure returned to baseline and stable Postop Assessment: no apparent nausea or vomiting Anesthetic complications: no   No notable events documented.  Last Vitals:  Vitals:   04/30/21 1345 04/30/21 1400  BP: 118/65 122/75  Pulse: 70 63  Resp: 18 16  Temp:  36.6 C  SpO2: 99% 98%    Last Pain:  Vitals:   04/30/21 1400  TempSrc:   PainSc: 0-No pain                 Misk Galentine P Makailyn Mccormick

## 2021-04-30 NOTE — H&P (Signed)
Chief Complaint: Right shoulder pain        History of Present Illness:    Pain Score: 8/10 SANE: 10/100   Dwayne Wade is a 70 y.o. male with right shoulder pain now ongoing for greater than 5 years.  He states that the pain is occurred insidiously and is not associated with any specific injury or accident.  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  He has had multiple nonoperative treatments including anti-inflammatory medications.  He is on tramadol as well as oxycodone as result of his significant shoulder pain.  He attempts to be very active and work out although this is severely limited due to his shoulder pain and dysfunction.  He has had multiple injections but they estimate is 5 total injections which are no longer efficacious.  His last injection was earlier in 2022 which provided essentially no pain relief.  He is here today to discuss further treatment and evaluation.   At this time, he has failed greater than 6 weeks of conservative nonoperative treatments including NSAID medication, activity modification, and steroid injections.  He has previously trialed physical therapy although this did not significantly reduce his pain.       Surgical History:   None   PMH/PSH/Family History/Social History/Meds/Allergies:   No past medical history on file.   Social History         Socioeconomic History   Marital status: Married      Spouse name: Not on file   Number of children: Not on file   Years of education: Not on file   Highest education level: Not on file  Occupational History   Not on file  Tobacco Use   Smoking status: Not on file   Smokeless tobacco: Not on file  Substance and Sexual Activity   Alcohol use: Not on file   Drug use: Not on file   Sexual activity: Not on file  Other Topics Concern   Not on file  Social History Narrative   Not on file    Social Determinants of Health    Financial  Resource Strain: Not on file  Food Insecurity: Not on file  Transportation Needs: Not on file  Physical Activity: Not on file  Stress: Not on file  Social Connections: Not on file    No family history on file. Not on File No current outpatient medications on file.    No current facility-administered medications for this visit.    Imaging Results (Last 48 hours)  No results found.     Review of Systems:   A ROS was performed including pertinent positives and negatives as documented in the HPI.   Physical Exam :   Constitutional: NAD and appears stated age Neurological: Alert and oriented Psych: Appropriate affect and cooperative There were no vitals taken for this visit.    Comprehensive Musculoskeletal Exam:     Musculoskeletal Exam      Inspection Right Left  Skin No atrophy or winging No atrophy or winging  Palpation      Tenderness Glenohumeral None  Range of Motion      Flexion (passive) 60 170  Flexion (active) 180   180  Extension 30 30  Abduction 20 170  ER at the side 0 70  ER at 90 of abduction Not tested 90  IR at 90 of abduction Not tested 60  Can reach behind back to Sacrum pain T10  Strength  4 out of 5 due to pain Normal  Special Tests      Pseudoparalytic No No  Neurologic      Fires PIN, radial, median, ulnar, musculocutaneous, axillary, suprascapular, long thoracic, and spinal accessory innervated muscles. No abnormal sensibility  Vascular/Lymphatic      Radial Pulse 2+ 2+  Cervical Exam      Patient has symmetric cervical range of motion with negative Spurling's test.  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     Assessment:   70 year old right-hand-dominant male with advanced rotator cuff arthropathy.  At this time he has exhausted all  nonoperative treatment modalities including shoe right steroid shoulder injections and medical management.  He is wishing to pursue right shoulder arthroplasty in order to improve his pain and his function.  I have advised that given his age in the setting of humeral head migration that I believe a reverse shoulder arthroplasty would be the ideal procedure for him.  We will plan to perform a CT scan for preoperative planning.       At this time, he has failed greater than 6 weeks of conservative nonoperative treatments including NSAID medication, activity modification, and steroid injections.  He has previously trialed physical therapy although this did not significantly reduce his pain. Plan :     -CT scan right shoulder for preoperative assessment -Plan for right shoulder reverse arthroplasty on an elective basis       After a lengthy discussion of treatment options, including risks, benefits, alternatives, complications of surgical and nonsurgical conservative options, the patient elected surgical repair.    The patient  is aware of the material risks  and complications including, but not limited to injury to adjacent structures, neurovascular injury, infection, numbness, bleeding, implant failure, thermal burns, stiffness, persistent pain, failure to heal, disease transmission from allograft, need for further surgery, dislocation, anesthetic risks, blood clots, risks of death,and others. The probabilities of surgical success and failure discussed with patient given their particular co-morbidities.The time and nature of expected rehabilitation and recovery was discussed.The patient's questions were all answered preoperatively.  No barriers to understanding were noted. I explained the natural history of the disease process and Rx rationale.  I explained to the patient what I considered to be reasonable expectations given their personal situation.  The final treatment plan was arrived at through a  shared patient decision making process model.   Patient was prescribed a shoulder immobilizer today for the diagnosis listed above under assessment. The patient requires stabilization from this orthosis in their right arm.  This will be provided in the operating room         I personally saw and evaluated the patient, and participated in the management and treatment plan.   Huel Cote, MD Attending Physician, Orthopedic Surgery

## 2021-04-30 NOTE — Progress Notes (Signed)
Orthopedic Tech Progress Note Patient Details:  TEANCUM BRULE 11-20-1950 [id number]  OR RN called requesting a SHOULDER ABDUCTION PILLOW for patient. Dropped off to OR DESK   Ortho Devices Type of Ortho Device: Shoulder abduction pillow Ortho Device/Splint Location: RUE Ortho Device/Splint Interventions: Ordered, Other (comment)   Post Interventions Patient Tolerated: Well Instructions Provided: Care of device  Donald Pore 04/30/2021, 1:04 PM

## 2021-04-30 NOTE — Discharge Instructions (Signed)
     Discharge Instructions    Attending Surgeon: Tahj Lindseth, MD Office Phone Number: [phone]   Diagnosis and Procedures:    Surgeries Performed: Right reverse shoulder arthroplasty  Discharge Plan:    Diet: Resume usual diet. Begin with light or bland foods.  Drink plenty of fluids.  Activity:  Keep sling and dressing in place until your follow up visit in Physical Therapy You are advised to go home directly from the hospital or surgical center. Restrict your activities.  GENERAL INSTRUCTIONS: 1.  Keep your surgical site elevated above your heart for at least 5-7 days or longer to prevent swelling. This will improve your comfort and your overall recovery following surgery.     2. Please call Dr. Jonty Morrical's office at [phone] with questions Monday-Friday during business hours. If no one answers, please leave a message and someone should get back to the patient within 24 hours. For emergencies please call 911 or proceed to the emergency room.   3. Patient to notify surgical team if experiences any of the following: Bowel/Bladder dysfunction, uncontrolled pain, nerve/muscle weakness, incision with increased drainage or redness, nausea/vomiting and Fever greater than 101.0 F.  Be alert for signs of infection including redness, streaking, odor, fever or chills. Be alert for excessive pain or bleeding and notify your surgeon immediately.  WOUND INSTRUCTIONS:   Leave your dressing/cast/splint in place until your post operative visit.  Keep it clean and dry.  Always keep the incision clean and dry until the staples/sutures are removed. If there is no drainage from the incision you should keep it open to air. If there is drainage from the incision you must keep it covered at all times until the drainage stops  Do not soak in a bath tub, hot tub, pool, lake or other body of water until 21 days after your surgery and your incision is completely dry and healed.  If you have  removable sutures (or staples) they must be removed 10-14 days (unless otherwise instructed) from the day of your surgery.     1)  Elevate the extremity as much as possible.  2)  Keep the dressing clean and dry.  3)  Please call us if the dressing becomes wet or dirty.  4)  If you are experiencing worsening pain or worsening swelling, please call.     MEDICATIONS: Resume all previous home medications at the previous prescribed dose and frequency unless otherwise noted Start taking the  pain medications on an as-needed basis as prescribed  Please taper down pain medication over the next week following surgery.  Ideally you should not require a refill of any narcotic pain medication.  Take pain medication with food to minimize nausea. In addition to the prescribed pain medication, you may take over-the-counter pain relievers such as Tylenol.  Do NOT take additional tylenol if your pain medication already has tylenol in it.  Aspirin 325mg daily for four weeks.      FOLLOWUP INSTRUCTIONS: 1. Follow up at the Physical Therapy Clinic 3-4 days following surgery. This appointment should be scheduled unless other arrangements have been made.The Physical Therapy scheduling number is [phone] if an appointment has not already been arranged.  2. Contact Dr. Sircharles Holzheimer's office during office hours at [phone] or the practice after hours line at [phone] for non-emergencies. For medical emergencies call 911.   Discharge Location: Home  

## 2021-04-30 NOTE — Anesthesia Procedure Notes (Signed)
Anesthesia Regional Block: Interscalene brachial plexus block   Pre-Anesthetic Checklist: , timeout performed,  Correct Patient, Correct Site, Correct Laterality,  Correct Procedure, Correct Position, site marked,  Risks and benefits discussed,  Surgical consent,  Pre-op evaluation,  At surgeon's request and post-op pain management  Laterality: Right  Prep: chloraprep       Needles:  Injection technique: Single-shot  Needle Type: Echogenic Stimulator Needle     Needle Length: 9cm  Needle Gauge: 21     Additional Needles:   Procedures:,,,, ultrasound used (permanent image in chart),,    Narrative:  Start time: 04/30/2021 8:00 AM End time: 04/30/2021 8:10 AM Injection made incrementally with aspirations every 5 mL.  Performed by: Personally  Anesthesiologist: Leonides Grills, MD  Additional Notes: Functioning IV was confirmed and monitors were applied.  A timeout was performed. Sterile prep, hand hygiene and sterile gloves were used. A 2mm 21ga Arrow echogenic stimulator needle was used. Negative aspiration and negative test dose prior to incremental administration of local anesthetic. The patient tolerated the procedure well.  Ultrasound guidance: relevent anatomy identified, needle position confirmed, local anesthetic spread visualized around nerve(s), vascular puncture avoided.  Image printed for medical record.

## 2021-04-30 NOTE — Brief Op Note (Signed)
   Brief Op Note  Date of Surgery: 04/30/2021  Preoperative Diagnosis: Right Rotator Cuff Arthropathy  Postoperative Diagnosis: same  Procedure: Procedure(s): RIGHT REVERSE SHOULDER ARTHROPLASTY  Implants: Implant Name Type Inv. Item Serial No. Manufacturer Lot No. LRB No. Used Action  Equinox Reverse Shoulder Fixed Angle Torque Defining Screw Kit Kit  [id number] EXACTECH  Right 1 Implanted  Reverse Shoulder Superior Augment Glenoid Plate 10"   [id number] EXACTECH  Right 1 Implanted  Glenosphere Locking Screw   [id number] EXACTECH  Right 1 Implanted  SCREW LOCK COMPR EQ 4.5X22 - [id number] Screw SCREW LOCK COMPR EQ 4.5X22 [id number] EXACTECH  Right 1 Implanted  COMPRESSION SCREW/LOCKING CAP KIT 4.5 X 30 MM/BLUE   [id number]  Right 1 Implanted  SCREW LOCK COMPR EQ 4.5X22 - [id number] Screw SCREW LOCK COMPR EQ 4.5X22 [id number] EXACTECH  Right 1 Implanted  COMPRESSION SCREW/LOCKING CAP KIT 4.5 X 26 MM/ORANGE   [id number] EXACTECH  Right 1 Implanted  GLENOSPHERE RSA EQUINOXE W/CAP - [id number] Joint GLENOSPHERE RSA EQUINOXE W/CAP [id number] EXACTECH  Right 1 Implanted  GLENOSPHERE & EXTENDED LOCKING CAPS(2) 38MM +4MM OFFSET   [id number] EXACTECH  Right 1 Implanted  HUMERAL STEM PRESS-FIT, PLASMA COATED   [id number] EXACTECH  Right 1 Implanted  EQUINOX REVERSE SHOULDER HUMERAL ADAPTER TRAAY   [id number]   Right 1 Implanted  EQUINOX REVERSE SHOULDER HUMERAL LINER   [id number]   Right 1 Implanted    Surgeons: Surgeon(s): Vanetta Mulders, MD  Anesthesia: Regional    Estimated Blood Loss: See anesthesia record  Complications: None  Condition to PACU: Stable  Yevonne Pax, MD 04/30/2021 12:56 PM

## 2021-04-30 NOTE — Anesthesia Procedure Notes (Addendum)
Procedure Name: Intubation Date/Time: 04/30/2021 8:45 AM Performed by: Leonor Liv, CRNA Pre-anesthesia Checklist: Patient identified, Emergency Drugs available, Suction available and Patient being monitored Patient Re-evaluated:Patient Re-evaluated prior to induction Oxygen Delivery Method: Circle System Utilized Preoxygenation: Pre-oxygenation with 100% oxygen Induction Type: IV induction Ventilation: Mask ventilation without difficulty and Oral airway inserted - appropriate to patient size Laryngoscope Size: Mac and 3 Grade View: Grade I Tube type: Oral Tube size: 7.5 mm Number of attempts: 1 Airway Equipment and Method: Stylet and Oral airway Placement Confirmation: ETT inserted through vocal cords under direct vision, positive ETCO2 and breath sounds checked- equal and bilateral Secured at: 22 cm Tube secured with: Tape Dental Injury: Teeth and Oropharynx as per pre-operative assessment

## 2021-05-01 ENCOUNTER — Telehealth (HOSPITAL_BASED_OUTPATIENT_CLINIC_OR_DEPARTMENT_OTHER): Payer: Self-pay | Admitting: Orthopaedic Surgery

## 2021-05-01 NOTE — Telephone Encounter (Signed)
Spoke to patient's wife.

## 2021-05-02 ENCOUNTER — Telehealth: Payer: Self-pay | Admitting: Orthopaedic Surgery

## 2021-05-02 ENCOUNTER — Other Ambulatory Visit (HOSPITAL_BASED_OUTPATIENT_CLINIC_OR_DEPARTMENT_OTHER): Payer: Self-pay | Admitting: Orthopaedic Surgery

## 2021-05-02 ENCOUNTER — Other Ambulatory Visit: Payer: Self-pay

## 2021-05-02 ENCOUNTER — Encounter (HOSPITAL_BASED_OUTPATIENT_CLINIC_OR_DEPARTMENT_OTHER): Payer: Self-pay | Admitting: Physical Therapy

## 2021-05-02 ENCOUNTER — Encounter (HOSPITAL_BASED_OUTPATIENT_CLINIC_OR_DEPARTMENT_OTHER): Payer: Medicare Other | Attending: Orthopaedic Surgery | Admitting: Physical Therapy

## 2021-05-02 DIAGNOSIS — R600 Localized edema: Secondary | ICD-10-CM | POA: Insufficient documentation

## 2021-05-02 DIAGNOSIS — M25611 Stiffness of right shoulder, not elsewhere classified: Secondary | ICD-10-CM | POA: Diagnosis not present

## 2021-05-02 DIAGNOSIS — M12811 Other specific arthropathies, not elsewhere classified, right shoulder: Secondary | ICD-10-CM | POA: Diagnosis not present

## 2021-05-02 DIAGNOSIS — M25511 Pain in right shoulder: Secondary | ICD-10-CM | POA: Insufficient documentation

## 2021-05-02 DIAGNOSIS — M6281 Muscle weakness (generalized): Secondary | ICD-10-CM | POA: Diagnosis not present

## 2021-05-02 MED ORDER — OXYCODONE HCL 5 MG PO TABS: 5.0000 mg | ORAL_TABLET | ORAL | Status: AC | PRN

## 2021-05-02 NOTE — Telephone Encounter (Signed)
Dwayne Wade with [facility] called stating the rx that was sent in on 04/30/21 needs to changed from capsules to tablets because that's all they have available. Dwayne Wade states a new e-script will have to be sent since its a narcotic.   Dwayne Wade CB# [phone]

## 2021-05-02 NOTE — Therapy (Signed)
OUTPATIENT PHYSICAL THERAPY SHOULDER EVALUATION   Patient Name: Dwayne Wade MRN: [id number] DOB:Nov 21, 1950, 70 y.o., male Today's Date: 05/02/2021   PT End of Session - 05/02/21 1235     Visit Number 1    Number of Visits 21    Date for PT Re-Evaluation 07/31/21    Authorization Type Medicare    PT Start Time 1023   pt arrives late   PT Stop Time 1105    PT Time Calculation (min) 42 min    Activity Tolerance Patient tolerated treatment well;No increased pain    Behavior During Therapy WFL for tasks assessed/performed             Past Medical History:  Diagnosis Date   Arthritis    Hypertension    Pre-diabetes    Past Surgical History:  Procedure Laterality Date   TONSILLECTOMY  1990   There are no problems to display for this patient.   PCP: Chiquita Loth, PA  REFERRING PROVIDER: Huel Cote, MD  REFERRING DIAG: [phone] (ICD-10-CM) - Rotator cuff arthropathy of right shoulder  Surgery date 04/30/2021 Total reverse shoulder  THERAPY DIAG:  Decreased right shoulder range of motion  Acute pain of right shoulder  Localized edema  Muscle weakness (generalized)   ONSET DATE: Surgery date 04/30/2021  SUBJECTIVE:                                                                                                                                                                                      SUBJECTIVE STATEMENT: Pt presents with wife who responds to questions due to pt HOH. She reports sleeping is difficulty due to pain position. Pt denies fever, nausea. Pt denies signs of acute inflammation. She states he is staying within the sling and has not taken it off. He has been compliant with precautions and sponge baths. She states they have been afraid to move the shoulder.  PERTINENT HISTORY: Pre-diabetic, HTN   PAIN:  Are you having pain? Yes VAS scale: 7/10 Pain location: R shoulder and arm Pain orientation: Right  PAIN TYPE: dull and  throbbing Pain description: intermittent    PRECAUTIONS: Shoulder  WEIGHT BEARING RESTRICTIONS Yes no R UE WB  FALLS:  Has patient fallen in last 6 months? No   LIVING ENVIRONMENT: Lives with: lives with their family and lives with their spouse Lives in: House/apartment Has following equipment at home: None  PLOF: Independent prior to surgery, currently has spouse at home to help; Pt was working at [employer]   PATIENT GOALS : Wife states he would like to return to [facility] and activity.   OBJECTIVE:   DIAGNOSTIC FINDINGS:  IMPRESSION: Status post right shoulder reverse total arthroplasty with expected overlying postoperative change. No evidence of perihardware fracture or component malpositioning.  PATIENT SURVEYS:  FOTO 4 53 expected at eval MCII 23 pts  COGNITION:  Overall cognitive status: Within functional limits for tasks assessed     SENSATION:  Light touch: Appears intact    PALPATION: Incision site clean, dry with island bandage covering site. No redness/warmth/purulent discharge. No discharge noted. Small unfurling of bandage at medial,distal edge. Pt given edu about need for MD call should signs of infection present.   No maceration of skin. Mild TTP around periscapulars and deltoid  POSTURE: Forward shoulders in sling.   UPPER EXTREMITY PROM:   Flexion to 90   ER to 10   ABD 50   (Blank rows = not tested)  UPPER EXTREMITY MMT:  Not tested due to surgical precautions  JOINT MOBILITY TESTING:  Stiffness and guarding during PROM   TODAY'S TREATMENT:   Exercises Seated Scapular Retraction - 3-5 x daily - 7 x weekly - 1 sets - 10 reps Circular Shoulder Pendulum with Table Support - 3-5 x daily - 7 x weekly - 1 sets - 20 reps Standing Elbow Flexion Extension AROM - 3-5 x daily - 7 x weekly - 1 sets - 20 reps Seated Gripping Towel - 3-5 x daily - 7 x weekly - 1 sets - 20 reps   PATIENT EDUCATION: Education details: MOI, surgical  precautions, diagnosis, prognosis, anatomy, exercise progression, DOMS expectations, muscle firing,  envelope of function, HEP, POC  Person educated: Patient and Spouse Education method: Explanation, Demonstration, Tactile cues, Verbal cues, and Handouts Education comprehension: verbalized understanding, returned demonstration, verbal cues required, and tactile cues required   HOME EXERCISE PROGRAM: Access Code: [facility] URL: [email or web address] Date: 05/02/2021 Prepared by: Zebedee Iba    ASSESSMENT:  CLINICAL IMPRESSION: Patient is a 70 y.o. male who was seen today for physical therapy evaluation and treatment for post op R reverse TSA. Pt's s/s consistent with post-op reverse TSA with good swelling and pain management. Pt and spouse appear to have good understanding of precautions but will likely need further review. Likely need reinforcement for continued movement to reduce fear avoidance. Objective impairments include decreased ROM, decreased strength, hypomobility, increased edema, increased fascial restrictions, increased muscle spasms, impaired flexibility, impaired UE functional use, improper body mechanics, postural dysfunction, and pain. These impairments are limiting patient from cleaning, community activity, driving, meal prep, occupation, laundry, yard work, and shopping. Personal factors including Age, Fitness, Time since onset of injury/illness/exacerbation, and 1-2 comorbidities:    are also affecting patient's functional outcome. Patient will benefit from skilled PT to address above impairments and improve overall function.  REHAB POTENTIAL: Good  CLINICAL DECISION MAKING: Stable/uncomplicated  EVALUATION COMPLEXITY: Low   GOALS:   SHORT TERM GOALS:  STG Name Target Date Goal status  1 Pt will become independent with HEP in order to demonstrate synthesis of PT education.   05/16/2021 INITIAL  2 Pt will score at least 23 pt increase on FOTO to  demonstrate functional improvement in MCII and pt perceived function.    06/13/2021 INITIAL  3 Pt will be able to demonstrate fwd flexion to 130 and ER to 30 in order to demonstrate functional improvement in UE ROM benchmark.   06/13/2021 INITIAL  4 Pt will be able to demonstrate full symmetrical ROM in all planes in order to demonstrate functional improvement in UE function for progression to next phase of rehabilitation.   06/13/2021  INITIAL   LONG TERM GOALS:   LTG Name Target Date Goal status  1 Pt  will become independent with final HEP in order to demonstrate synthesis of PT education.   07/25/2021 INITIAL  2 Pt will score >/= 53 on FOTO to demonstrate functional improvement in R shoulder function for return to PLOF.   07/25/2021 INITIAL  3 Pt will be able to reach [facility] and carry/hold >5 lbs in order to demonstrate functional improvement in R UE strength for return to PLOF and exercise.   07/25/2021 INITIAL  4 Pt will be able to demonstrate full AROM in all planes in order to demonstrate functional improvement in UE function for self-care and house hold duties.   07/25/2021 INITIAL   PLAN: PT FREQUENCY: 2x/week  PT DURATION: 12 weeks  PLANNED INTERVENTIONS: Therapeutic exercises, Therapeutic activity, Neuro Muscular re-education, Patient/Family education, Joint mobilization, Aquatic Therapy, Dry Needling, Electrical stimulation, Spinal mobilization, Cryotherapy, Moist heat, scar mobilization, Splintting, Taping, Vasopneumatic device, Traction, Ultrasound, Ionotophoresis 4mg /ml Dexamethasone, and Manual therapy  PLAN FOR NEXT SESSION: review HEP, PROM, table flexion, wrist PRE, no IR  PT, DPT 05/02/21 12:36 PM

## 2021-05-05 ENCOUNTER — Other Ambulatory Visit: Payer: Self-pay

## 2021-05-05 ENCOUNTER — Encounter (HOSPITAL_BASED_OUTPATIENT_CLINIC_OR_DEPARTMENT_OTHER): Payer: Medicare Other | Admitting: Physical Therapy

## 2021-05-05 ENCOUNTER — Encounter (HOSPITAL_BASED_OUTPATIENT_CLINIC_OR_DEPARTMENT_OTHER): Payer: Self-pay | Admitting: Physical Therapy

## 2021-05-05 DIAGNOSIS — M25611 Stiffness of right shoulder, not elsewhere classified: Secondary | ICD-10-CM

## 2021-05-05 DIAGNOSIS — M25511 Pain in right shoulder: Secondary | ICD-10-CM

## 2021-05-05 DIAGNOSIS — M6281 Muscle weakness (generalized): Secondary | ICD-10-CM

## 2021-05-05 DIAGNOSIS — R600 Localized edema: Secondary | ICD-10-CM

## 2021-05-05 NOTE — Therapy (Signed)
OUTPATIENT PHYSICAL THERAPY TREATMENT NOTE   Patient Name: Dwayne Wade MRN: [id number] DOB:11-13-1950, 70 y.o., male Today's Date: 05/05/2021  PCP: Chiquita Loth, PA REFERRING PROVIDER: Chiquita Loth, PA    Past Medical History:  Diagnosis Date   Arthritis    Hypertension    Pre-diabetes    Past Surgical History:  Procedure Laterality Date   REVERSE SHOULDER ARTHROPLASTY Right 04/30/2021   Procedure: RIGHT REVERSE SHOULDER ARTHROPLASTY;  Surgeon: Huel Cote, MD;  Location: [facility] OR;  Service: Orthopedics;  Laterality: Right;   TONSILLECTOMY  1990   There are no problems to display for this patient.   REFERRING DIAG:  M12.811 (ICD-10-CM) - Rotator cuff arthropathy of right shoulder  Surgery date 04/30/2021 Total reverse shoulder  THERAPY DIAG:  Decreased right shoulder range of motion  Acute pain of right shoulder  Localized edema  Muscle weakness (generalized)  PERTINENT HISTORY: Pre-diabetic, HTN   PRECAUTIONS: Shoulder- no R UE WB  SUBJECTIVE: Pt states he had no pain with HEP. He has some trouble with remembering technique. Pt's wife states she gave him a pain pill before this session.  PAIN:  Are you having pain? No    OBJECTIVE:   TODAY'S TREATMENT:  10/24   Seated Scapular Retraction - 20x Circular Shoulder Pendulum with Table Support - 20x ML and Fwd retro Standing Elbow Flexion Extension AROM -20x Wrist PRE 2lb DB 20x each direction Supine AAROM self flexion 3s 15x  PROM: up to 86 flexion, ABD 40 (active spasm/guarding) Pt with visible apprehension once moving and with therapist holding R UE    Eval Treatment Exercises Seated Scapular Retraction - 3-5 x daily - 7 x weekly - 1 sets - 10 reps Circular Shoulder Pendulum with Table Support - 3-5 x daily - 7 x weekly - 1 sets - 20 reps Standing Elbow Flexion Extension AROM - 3-5 x daily - 7 x weekly - 1 sets - 20 reps Seated Gripping Towel - 3-5 x daily - 7 x weekly - 1  sets - 20 reps      PATIENT EDUCATION: Education details: MOI, surgical precautions, diagnosis, prognosis, anatomy, exercise progression, DOMS expectations, muscle firing,  envelope of function, HEP, POC   Person educated: Patient and Spouse Education method: Explanation, Demonstration, Tactile cues, Verbal cues, and Handouts Education comprehension: verbalized understanding, returned demonstration, verbal cues required, and tactile cues required     HOME EXERCISE PROGRAM: Access Code: [facility] URL: [email or web address] Date: 05/05/2021 Prepared by: Zebedee Iba  Exercises Seated Scapular Retraction - 3-5 x daily - 7 x weekly - 1 sets - 10 reps Circular Shoulder Pendulum with Table Support - 3-5 x daily - 7 x weekly - 1 sets - 20 reps Standing Elbow Flexion Extension AROM - 3-5 x daily - 7 x weekly - 1 sets - 20 reps Seated Gripping Towel - 3-5 x daily - 7 x weekly - 1 sets - 20 reps Supine Shoulder Flexion AAROM - 2 x daily - 7 x weekly - 1 sets - 15 reps - 5 hold      ASSESSMENT:   CLINICAL IMPRESSION:  Pt tolerated PROM well at today session but have visible apprehension as R UE moved towards 90 deg of flexion. Pt without pain during session and able to demonstrate good performance of HEP with VC and TC for proper technique and form. Pt able to incorporate AAROM flexion into HEP without increased pain. Plan to continue with flexion ROM as tolerated. Pt would benefit from continued skilled therapy  in order to reach goals and maximize functional R UE strength and ROM for full return to PLOF.   Objective impairments include decreased ROM, decreased strength, hypomobility, increased edema, increased fascial restrictions, increased muscle spasms, impaired flexibility, impaired UE functional use, improper body mechanics, postural dysfunction, and pain. These impairments are limiting patient from cleaning, community activity, driving, meal prep, occupation, laundry, yard  work, and shopping. Personal factors including Age, Fitness, Time since onset of injury/illness/exacerbation, and 1-2 comorbidities:    are also affecting patient's functional outcome.   REHAB POTENTIAL: Good   CLINICAL DECISION MAKING: Stable/uncomplicated   EVALUATION COMPLEXITY: Low     GOALS:     SHORT TERM GOALS:   STG Name Target Date Goal status  1 Pt will become independent with HEP in order to demonstrate synthesis of PT education.    05/16/2021 INITIAL  2 Pt will score at least 23 pt increase on FOTO to demonstrate functional improvement in MCII and pt perceived function.     06/13/2021 INITIAL  3 Pt will be able to demonstrate fwd flexion to 130 and ER to 30 in order to demonstrate functional improvement in UE ROM benchmark.    06/13/2021 INITIAL  4 Pt will be able to demonstrate full symmetrical ROM in all planes in order to demonstrate functional improvement in UE function for progression to next phase of rehabilitation.     06/13/2021 INITIAL    LONG TERM GOALS:    LTG Name Target Date Goal status  1 Pt  will become independent with final HEP in order to demonstrate synthesis of PT education.     07/25/2021 INITIAL  2 Pt will score >/= 53 on FOTO to demonstrate functional improvement in R shoulder function for return to PLOF.     07/25/2021 INITIAL  3 Pt will be able to reach [facility] and carry/hold >5 lbs in order to demonstrate functional improvement in R UE strength for return to PLOF and exercise.    07/25/2021 INITIAL  4 Pt will be able to demonstrate full AROM in all planes in order to demonstrate functional improvement in UE function for self-care and house hold duties.    07/25/2021 INITIAL    PLAN: PT FREQUENCY: 2x/week   PT DURATION: 12 weeks   PLANNED INTERVENTIONS: Therapeutic exercises, Therapeutic activity, Neuro Muscular re-education, Patient/Family education, Joint mobilization, Aquatic Therapy, Dry Needling, Electrical stimulation, Spinal mobilization,  Cryotherapy, Moist heat, scar mobilization, Splintting, Taping, Vasopneumatic device, Traction, Ultrasound, Ionotophoresis 4mg /ml Dexamethasone, and Manual therapy   PLAN FOR NEXT SESSION: review HEP, PROM, table flexion, wrist PRE, no IR   PT, DPT 05/05/21 5:37 PM

## 2021-05-07 ENCOUNTER — Ambulatory Visit (HOSPITAL_BASED_OUTPATIENT_CLINIC_OR_DEPARTMENT_OTHER): Payer: Medicare Other | Attending: Orthopaedic Surgery | Admitting: Physical Therapy

## 2021-05-07 ENCOUNTER — Other Ambulatory Visit: Payer: Self-pay

## 2021-05-07 ENCOUNTER — Encounter (HOSPITAL_BASED_OUTPATIENT_CLINIC_OR_DEPARTMENT_OTHER): Payer: Self-pay | Admitting: Physical Therapy

## 2021-05-07 DIAGNOSIS — M25611 Stiffness of right shoulder, not elsewhere classified: Secondary | ICD-10-CM | POA: Insufficient documentation

## 2021-05-07 DIAGNOSIS — M25511 Pain in right shoulder: Secondary | ICD-10-CM | POA: Insufficient documentation

## 2021-05-07 DIAGNOSIS — R600 Localized edema: Secondary | ICD-10-CM | POA: Insufficient documentation

## 2021-05-07 DIAGNOSIS — M6281 Muscle weakness (generalized): Secondary | ICD-10-CM | POA: Insufficient documentation

## 2021-05-07 NOTE — Therapy (Signed)
OUTPATIENT PHYSICAL THERAPY TREATMENT NOTE   Patient Name: Dwayne Wade MRN: [id number] DOB:Nov 20, 1950, 70 y.o., male Today's Date: 05/07/2021  PCP: Chiquita Loth, PA REFERRING PROVIDER: Chiquita Loth, PA   PT End of Session - 05/07/21 1724     Visit Number 3    Number of Visits 21    Date for PT Re-Evaluation 07/31/21    Authorization Type Medicare    PT Start Time 1635    PT Stop Time 1715    PT Time Calculation (min) 40 min    Activity Tolerance Patient tolerated treatment well;No increased pain    Behavior During Therapy WFL for tasks assessed/performed             Past Medical History:  Diagnosis Date   Arthritis    Hypertension    Pre-diabetes    Past Surgical History:  Procedure Laterality Date   REVERSE SHOULDER ARTHROPLASTY Right 04/30/2021   Procedure: RIGHT REVERSE SHOULDER ARTHROPLASTY;  Surgeon: Huel Cote, MD;  Location: [facility] OR;  Service: Orthopedics;  Laterality: Right;   TONSILLECTOMY  1990   There are no problems to display for this patient.   REFERRING DIAG:  M12.811 (ICD-10-CM) - Rotator cuff arthropathy of right shoulder  Surgery date 04/30/2021 Total reverse shoulder  THERAPY DIAG:  Decreased right shoulder range of motion  Acute pain of right shoulder  Localized edema  Muscle weakness (generalized)  PERTINENT HISTORY: Pre-diabetic, HTN   PRECAUTIONS: Shoulder- no R UE WB  SUBJECTIVE: Pt states he did well after last session and was without pain. Pt states he was concerned about a "bone sticking up."  PAIN:  Are you having pain? No    OBJECTIVE: AAROM self flexion to 102 deg, ER in scaption 30  TODAY'S TREATMENT:  10/26   Seated Scapular Retraction, elevation, and depression - 20x Circular Shoulder Pendulum with Table Support - 20x ML and Fwd retro Standing Elbow Flexion Extension AROM -20x with towel roll Wrist PRE 3lb DB 20x each direction Supine AAROM self flexion 3s 15x  PROM: up to 90  flexion, ABD 45 (active spasm/guarding)  ER to 20 before spasm. Pt required VC for relaxation and deep breathing. Not pain limited  Goals: 130 flexion, ER 30 in scaption  10/24   Seated Scapular Retraction - 20x Circular Shoulder Pendulum with Table Support - 20x ML and Fwd retro Standing Elbow Flexion Extension AROM -20x Wrist PRE 2lb DB 20x each direction Supine AAROM self flexion 3s 15x  PROM: up to 86 flexion, ABD 40 (active spasm/guarding) Pt with visible apprehension once moving and with therapist holding R UE    Eval Treatment Exercises Seated Scapular Retraction - 3-5 x daily - 7 x weekly - 1 sets - 10 reps Circular Shoulder Pendulum with Table Support - 3-5 x daily - 7 x weekly - 1 sets - 20 reps Standing Elbow Flexion Extension AROM - 3-5 x daily - 7 x weekly - 1 sets - 20 reps Seated Gripping Towel - 3-5 x daily - 7 x weekly - 1 sets - 20 reps      PATIENT EDUCATION: Education details: MOI, surgical precautions, diagnosis, prognosis, anatomy, exercise progression, DOMS expectations, muscle firing,  envelope of function, HEP, POC   Person educated: Patient and Spouse Education method: Explanation, Demonstration, Tactile cues, Verbal cues, and Handouts Education comprehension: verbalized understanding, returned demonstration, verbal cues required, and tactile cues required     HOME EXERCISE PROGRAM: Access Code: [facility] URL: [email or web address] Date: 05/07/2021 Prepared by: Zebedee Iba  Exercises Seated Scapular Retraction - 3-5 x daily - 7 x weekly - 1 sets - 10 reps Circular Shoulder Pendulum with Table Support - 3-5 x daily - 7 x weekly - 1 sets - 20 reps Standing Elbow Flexion Extension AROM - 3-5 x daily - 7 x weekly - 1 sets - 20 reps Seated Gripping Towel - 3-5 x daily - 7 x weekly - 1 sets - 20 reps Supine Shoulder Flexion AAROM - 2 x daily - 7 x weekly - 1 sets - 15 reps - 5 hold Supine Shoulder External Rotation with Dowel at 20 Degrees  of Abduction - 2 x daily - 7 x weekly - 1 sets - 15 reps - 5 hold       ASSESSMENT:   CLINICAL IMPRESSION:  Pt performed very well today with improving ROM into flexion and ER. Pt with very well managed pain and no pain during A/PROM. ER stretching exercise in scaption incorporated into HEP without issue. Cuing was needed throughout the performance of pendulum exercise in order to promote gentle traction and improve overall shoulder stiffness/guarding. Pt to see MD Nov 1 and return to therapy directly after MD appt. Pt would benefit from continued skilled therapy in order to reach goals and maximize functional R UE strength and ROM for full return to PLOF.   Objective impairments include decreased ROM, decreased strength, hypomobility, increased edema, increased fascial restrictions, increased muscle spasms, impaired flexibility, impaired UE functional use, improper body mechanics, postural dysfunction, and pain. These impairments are limiting patient from cleaning, community activity, driving, meal prep, occupation, laundry, yard work, and shopping. Personal factors including Age, Fitness, Time since onset of injury/illness/exacerbation, and 1-2 comorbidities:    are also affecting patient's functional outcome.   REHAB POTENTIAL: Good   CLINICAL DECISION MAKING: Stable/uncomplicated   EVALUATION COMPLEXITY: Low     GOALS:     SHORT TERM GOALS:   STG Name Target Date Goal status  1 Pt will become independent with HEP in order to demonstrate synthesis of PT education.    05/16/2021 INITIAL  2 Pt will score at least 23 pt increase on FOTO to demonstrate functional improvement in MCII and pt perceived function.     06/13/2021 INITIAL  3 Pt will be able to demonstrate fwd flexion to 130 and ER to 30 in order to demonstrate functional improvement in UE ROM benchmark.    06/13/2021 INITIAL  4 Pt will be able to demonstrate full symmetrical ROM in all planes in order to demonstrate functional  improvement in UE function for progression to next phase of rehabilitation.     06/13/2021 INITIAL    LONG TERM GOALS:    LTG Name Target Date Goal status  1 Pt  will become independent with final HEP in order to demonstrate synthesis of PT education.     07/25/2021 INITIAL  2 Pt will score >/= 53 on FOTO to demonstrate functional improvement in R shoulder function for return to PLOF.     07/25/2021 INITIAL  3 Pt will be able to reach [facility] and carry/hold >5 lbs in order to demonstrate functional improvement in R UE strength for return to PLOF and exercise.    07/25/2021 INITIAL  4 Pt will be able to demonstrate full AROM in all planes in order to demonstrate functional improvement in UE function for self-care and house hold duties.    07/25/2021 INITIAL    PLAN: PT FREQUENCY: 2x/week   PT DURATION: 12 weeks  PLANNED INTERVENTIONS: Therapeutic exercises, Therapeutic activity, Neuro Muscular re-education, Patient/Family education, Joint mobilization, Aquatic Therapy, Dry Needling, Electrical stimulation, Spinal mobilization, Cryotherapy, Moist heat, scar mobilization, Splintting, Taping, Vasopneumatic device, Traction, Ultrasound, Ionotophoresis 4mg /ml Dexamethasone, and Manual therapy   PLAN FOR NEXT SESSION: review HEP, PROM, table flexion, table ER   PT, DPT 05/07/21 5:25 PM

## 2021-05-12 ENCOUNTER — Other Ambulatory Visit (HOSPITAL_BASED_OUTPATIENT_CLINIC_OR_DEPARTMENT_OTHER): Payer: Self-pay

## 2021-05-12 ENCOUNTER — Other Ambulatory Visit (HOSPITAL_BASED_OUTPATIENT_CLINIC_OR_DEPARTMENT_OTHER): Payer: Self-pay | Admitting: Orthopaedic Surgery

## 2021-05-12 DIAGNOSIS — M12811 Other specific arthropathies, not elsewhere classified, right shoulder: Secondary | ICD-10-CM

## 2021-05-13 ENCOUNTER — Ambulatory Visit (HOSPITAL_BASED_OUTPATIENT_CLINIC_OR_DEPARTMENT_OTHER): Payer: Medicare Other | Attending: Orthopaedic Surgery | Admitting: Physical Therapy

## 2021-05-13 ENCOUNTER — Ambulatory Visit (HOSPITAL_BASED_OUTPATIENT_CLINIC_OR_DEPARTMENT_OTHER)
Admission: RE | Admit: 2021-05-13 | Discharge: 2021-05-13 | Disposition: A | Discharge status: Home / Self Care | Payer: Medicare Other | Source: Ambulatory Visit | Attending: Orthopaedic Surgery | Admitting: Orthopaedic Surgery

## 2021-05-13 ENCOUNTER — Other Ambulatory Visit: Payer: Self-pay

## 2021-05-13 ENCOUNTER — Ambulatory Visit (INDEPENDENT_AMBULATORY_CARE_PROVIDER_SITE_OTHER): Payer: Medicare Other | Admitting: Orthopaedic Surgery

## 2021-05-13 ENCOUNTER — Encounter (HOSPITAL_BASED_OUTPATIENT_CLINIC_OR_DEPARTMENT_OTHER): Payer: Self-pay | Admitting: Physical Therapy

## 2021-05-13 DIAGNOSIS — M12811 Other specific arthropathies, not elsewhere classified, right shoulder: Secondary | ICD-10-CM | POA: Diagnosis not present

## 2021-05-13 DIAGNOSIS — M6281 Muscle weakness (generalized): Secondary | ICD-10-CM | POA: Diagnosis present

## 2021-05-13 DIAGNOSIS — M25511 Pain in right shoulder: Secondary | ICD-10-CM | POA: Diagnosis present

## 2021-05-13 DIAGNOSIS — M25611 Stiffness of right shoulder, not elsewhere classified: Secondary | ICD-10-CM | POA: Insufficient documentation

## 2021-05-13 NOTE — Progress Notes (Signed)
Post Operative Evaluation    Procedure/Date of Surgery: Right reverse shoulder arthroplasty performed Dwayne Wade/19/2020  Interval History:    Dwayne Wade presents today 2-week status post right reverse shoulder arthroplasty.  He has been doing remarkably well.  Pain is much better at this time.  Range of motion is significantly improved.   PMH/PSH/Family History/Social History/Meds/Allergies:    Past Medical History:  Diagnosis Date   Arthritis    Hypertension    Pre-diabetes    Past Surgical History:  Procedure Laterality Date   REVERSE SHOULDER ARTHROPLASTY Right Dwayne Wade/19/2022   Procedure: RIGHT REVERSE SHOULDER ARTHROPLASTY;  Surgeon: Huel Cote, MD;  Location: [facility] OR;  Service: Orthopedics;  Laterality: Right;   TONSILLECTOMY  1990   Social History   Socioeconomic History   Marital status: Married    Spouse name: Not on file   Number of children: Not on file   Years of education: Not on file   Highest education level: Not on file  Occupational History   Not on file  Tobacco Use   Smoking status: Never   Smokeless tobacco: Never  Vaping Use   Vaping Use: Never used  Substance and Sexual Activity   Alcohol use: Not Currently   Drug use: Never   Sexual activity: Yes    Birth control/protection: None  Other Topics Concern   Not on file  Social History Narrative   Not on file   Social Determinants of Health   Financial Resource Strain: Not on file  Food Insecurity: Not on file  Transportation Needs: Not on file  Physical Activity: Not on file  Stress: Not on file  Social Connections: Not on file   No family history on file. Allergies  Allergen Reactions   Ibuprofen Nausea Only   Current Outpatient Medications  Medication Sig Dispense Refill   amLODipine (NORVASC) 5 MG tablet Take 5 mg by mouth in the morning.     aspirin EC 325 MG tablet Take 1 tablet (325 mg total) by mouth daily. 30 tablet 0   Cholecalciferol (VITAMIN D3) 50  MCG (2000 UT) TABS Take 2,000 Units by mouth every evening.     Dapagliflozin Propanediol (FARXIGA PO) Take by mouth.     DEXILANT 60 MG capsule Take 60 mg by mouth every evening.     hydrOXYzine (ATARAX/VISTARIL) 25 MG tablet Take 25 mg by mouth 4 (four) times daily as needed for anxiety.     losartan-hydrochlorothiazide (HYZAAR) 100-25 MG tablet Take 1 tablet by mouth every evening.     magnesium oxide (MAG-OX) 400 MG tablet Take 400 mg by mouth every evening.     meclizine (ANTIVERT) 25 MG tablet Take 25 mg by mouth every 8 (eight) hours as needed for dizziness.     montelukast (SINGULAIR) Dwayne Wade MG tablet Take Dwayne Wade mg by mouth daily as needed (allergies/respiratory issues).     Multiple Vitamin (MULTIVITAMIN WITH MINERALS) TABS tablet Take 1 tablet by mouth every evening.     oxycodone (OXY-IR) 5 MG capsule Take 1 capsule (5 mg total) by mouth every 4 (four) hours as needed (severe pain). 20 capsule 0   oxyCODONE (ROXICODONE) 15 MG immediate release tablet Take 15 mg by mouth every 4 (four) hours as needed for pain.     PROLATE Dwayne Wade-300 MG tablet Take 1 tablet by mouth in  the morning, at noon, and at bedtime.     simvastatin (ZOCOR) 40 MG tablet Take 40 mg by mouth at bedtime.     traMADol (ULTRAM) 50 MG tablet Take 50 mg by mouth in the morning, at noon, in the evening, and at bedtime.     Vitamin E 450 MG (1000 UT) CAPS Take 1,000 Units by mouth every evening.     zolpidem (AMBIEN) Dwayne Wade MG tablet Take Dwayne Wade mg by mouth at bedtime.     Current Facility-Administered Medications  Medication Dose Route Frequency Provider Last Rate Last Admin   oxyCODONE (Oxy IR/ROXICODONE) immediate release tablet 5 mg  5 mg Oral Q4H PRN Huel Cote, MD       No results found.  Review of Systems:   A ROS was performed including pertinent positives and negatives as documented in the HPI.   Musculoskeletal Exam:    There were no vitals taken for this visit. Active range of motion is 200 degrees forward elevation  external rotation at the side is to 35 degrees.  Sensation is intact in all distributions of the right arm.  He is able to flex and extend at the right elbow  Imaging:    3 views right shoulder x-ray  Intact reverse shoulder arthroplasty without evidence of complication  I personally reviewed and interpreted the radiographs.   Assessment:   70 year old male with right reverse shoulder arthroplasty 2 weeks postop, overall doing extremely well.  At this time I would like to advance him to active range of motion as tolerated.  He will be in the sling only at night and while traveling for the additional 2 weeks.  After this he may be out of the sling completely.  He may proceed with active range of motion as tolerated  Plan :    -Return to clinic in 1 month -He will continue to rehab over the next month and will remain out of work for rehabilitation   I personally saw and evaluated the patient, and participated in the management and treatment plan.  Huel Cote, MD Attending Physician, Orthopedic Surgery  This document was dictated using Dragon voice recognition software. A reasonable attempt at proof reading has been made to minimize errors.

## 2021-05-13 NOTE — Therapy (Signed)
OUTPATIENT PHYSICAL THERAPY TREATMENT NOTE   Patient Name: Dwayne Wade MRN: [id number] DOB:[birth year], 70 y.o., male Today's Date: 05/14/2021  PCP: Chiquita Loth, PA REFERRING PROVIDER: Huel Cote, MD   PT End of Session - 05/13/21 1156     Visit Number 4    Number of Visits 21    Date for PT Re-Evaluation 07/31/21    Authorization Type Medicare    PT Start Time 1120    PT Stop Time 1151    PT Time Calculation (min) 31 min    Activity Tolerance Patient tolerated treatment well;No increased pain    Behavior During Therapy WFL for tasks assessed/performed              Past Medical History:  Diagnosis Date   Arthritis    Hypertension    Pre-diabetes    Past Surgical History:  Procedure Laterality Date   REVERSE SHOULDER ARTHROPLASTY Right 04/30/2021   Procedure: RIGHT REVERSE SHOULDER ARTHROPLASTY;  Surgeon: Huel Cote, MD;  Location: [facility] OR;  Service: Orthopedics;  Laterality: Right;   TONSILLECTOMY  1990   There are no problems to display for this patient.   REFERRING DIAG:  M12.811 (ICD-10-CM) - Rotator cuff arthropathy of right shoulder  Surgery date 04/30/2021 Total reverse shoulder  THERAPY DIAG:  Decreased right shoulder range of motion  Acute pain of right shoulder  Muscle weakness (generalized)  PERTINENT HISTORY: Pre-diabetic, HTN   PRECAUTIONS: Shoulder- no R UE WB  SUBJECTIVE:  Pt just saw MD.  Pt's wife states that MD stated pt is doing well.  He had x rays and pt states MD stated everything looked good.  MD removed abduction pillow.  Pt denies any adverse effects after prior Rx.  Pt reports compliance with HEP.  Pt states he has pain with supine AAROM flexion when he is lowering R UE.  He has stiffness 1st thing in AM.  Pt is sleeping in the recliner and sleeps pretty well.  Pt states MD informed him he could shower.  Pt's wife states he had staples removed.   PAIN:  Are you having pain? Yes NPRS:  4-5/10 in R shoulder.   Pt states it's sore.    OBJECTIVE:   TODAY'S TREATMENT:      OBSERVATION:  Pt has steri strips over incision.  No signs of infection.     Therapeutic Exercise: -Reviewed response to prior Rx, HEP compliance, current pt presentation, and pain level. -Pt performed: Seated Scapular Retraction x20 reps Pendulums cw, ccw, and f/b x 20 reps each -Pt received R shoulder PROM in flexion, scaption, and ER in supine per protocol range and pt/tissue tolerance       PATIENT EDUCATION: Education details: surgical precautions, protocol, HEP, and POC  Person educated: Patient and Spouse Education method: Explanation, Demonstration, Tactile cues, Verbal cues Education comprehension: verbalized understanding, returned demonstration, verbal cues required, and tactile cues required     HOME EXERCISE PROGRAM: Access Code: [facility] URL: [email or web address] Date: 05/07/2021 Prepared by: Zebedee Iba  Exercises Seated Scapular Retraction - 3-5 x daily - 7 x weekly - 1 sets - 10 reps Circular Shoulder Pendulum with Table Support - 3-5 x daily - 7 x weekly - 1 sets - 20 reps Standing Elbow Flexion Extension AROM - 3-5 x daily - 7 x weekly - 1 sets - 20 reps Seated Gripping Towel - 3-5 x daily - 7 x weekly - 1 sets - 20 reps Supine Shoulder Flexion AAROM - 2 x daily - 7  x weekly - 1 sets - 15 reps - 5 hold Supine Shoulder External Rotation with Dowel at 20 Degrees of Abduction - 2 x daily - 7 x weekly - 1 sets - 15 reps - 5 hold       ASSESSMENT:   CLINICAL IMPRESSION:  Pt saw MD today and had the Abd pillow removed.  Rx time limited today due to pt arriving late due to ortho MD appt.  Pt requires cuing and instruction in correct form with pendulums and scap retractions.  Pt tolerated PROM well and did require some cuing to relax R UE with PROM.  He is progressing appropriately with shoulder PROM per protocol.  Pt responded well to Rx having no increased pain after Rx.  Pt  should benefit from continued skilled therapy in order to reach goals and maximize functional R UE strength and ROM for full return to PLOF.   Objective impairments include decreased ROM, decreased strength, hypomobility, increased edema, increased fascial restrictions, increased muscle spasms, impaired flexibility, impaired UE functional use, improper body mechanics, postural dysfunction, and pain. These impairments are limiting patient from cleaning, community activity, driving, meal prep, occupation, laundry, yard work, and shopping. Personal factors including Age, Fitness, Time since onset of injury/illness/exacerbation, and 1-2 comorbidities:    are also affecting patient's functional outcome.         REHAB POTENTIAL: Good   CLINICAL DECISION MAKING: Stable/uncomplicated   EVALUATION COMPLEXITY: Low     GOALS:     SHORT TERM GOALS:   STG Name Target Date Goal status  1 Pt will become independent with HEP in order to demonstrate synthesis of PT education.    05/16/2021 INITIAL  2 Pt will score at least 23 pt increase on FOTO to demonstrate functional improvement in MCII and pt perceived function.     06/13/2021 INITIAL  3 Pt will be able to demonstrate fwd flexion to 130 and ER to 30 in order to demonstrate functional improvement in UE ROM benchmark.    06/13/2021 INITIAL  4 Pt will be able to demonstrate full symmetrical ROM in all planes in order to demonstrate functional improvement in UE function for progression to next phase of rehabilitation.     06/13/2021 INITIAL    LONG TERM GOALS:    LTG Name Target Date Goal status  1 Pt  will become independent with final HEP in order to demonstrate synthesis of PT education.     07/25/2021 INITIAL  2 Pt will score >/= 53 on FOTO to demonstrate functional improvement in R shoulder function for return to PLOF.     07/25/2021 INITIAL  3 Pt will be able to reach [facility] and carry/hold >5 lbs in order to demonstrate functional improvement in R  UE strength for return to PLOF and exercise.    07/25/2021 INITIAL  4 Pt will be able to demonstrate full AROM in all planes in order to demonstrate functional improvement in UE function for self-care and house hold duties.    07/25/2021 INITIAL    PLAN: PT FREQUENCY: 2x/week   PT DURATION: 12 weeks   PLANNED INTERVENTIONS: Therapeutic exercises, Therapeutic activity, Neuro Muscular re-education, Patient/Family education, Joint mobilization, Aquatic Therapy, Dry Needling, Electrical stimulation, Spinal mobilization, Cryotherapy, Moist heat, scar mobilization, Splintting, Taping, Vasopneumatic device, Traction, Ultrasound, Ionotophoresis 4mg /ml Dexamethasone, and Manual therapy   PLAN FOR NEXT SESSION: review HEP, PROM, cont per MD protocol.    III PT, DPT 05/14/21 7:13 AM

## 2021-05-15 ENCOUNTER — Ambulatory Visit (HOSPITAL_BASED_OUTPATIENT_CLINIC_OR_DEPARTMENT_OTHER): Payer: Medicare Other | Admitting: Physical Therapy

## 2021-05-15 ENCOUNTER — Encounter (HOSPITAL_BASED_OUTPATIENT_CLINIC_OR_DEPARTMENT_OTHER): Payer: Self-pay | Admitting: Physical Therapy

## 2021-05-15 ENCOUNTER — Telehealth: Payer: Self-pay | Admitting: Orthopaedic Surgery

## 2021-05-15 ENCOUNTER — Other Ambulatory Visit: Payer: Self-pay

## 2021-05-15 DIAGNOSIS — M25611 Stiffness of right shoulder, not elsewhere classified: Secondary | ICD-10-CM

## 2021-05-15 DIAGNOSIS — M6281 Muscle weakness (generalized): Secondary | ICD-10-CM

## 2021-05-15 DIAGNOSIS — M25511 Pain in right shoulder: Secondary | ICD-10-CM

## 2021-05-15 NOTE — Therapy (Signed)
OUTPATIENT PHYSICAL THERAPY TREATMENT NOTE   Patient Name: Dwayne Wade MRN: [id number] DOB:[birth year], 70 y.o., male Today's Date: 05/16/2021  PCP: Chiquita Loth, PA REFERRING MD:  Huel Cote, MD  PT End of Session - 05/15/21 1409     Visit Number 5    Number of Visits 21    Date for PT Re-Evaluation 07/31/21    Authorization Type Medicare    PT Start Time 1352    PT Stop Time 1431    PT Time Calculation (min) 39 min    Activity Tolerance Patient tolerated treatment well;No increased pain    Behavior During Therapy WFL for tasks assessed/performed               Past Medical History:  Diagnosis Date   Arthritis    Hypertension    Pre-diabetes    Past Surgical History:  Procedure Laterality Date   REVERSE SHOULDER ARTHROPLASTY Right 04/30/2021   Procedure: RIGHT REVERSE SHOULDER ARTHROPLASTY;  Surgeon: Huel Cote, MD;  Location: [facility] OR;  Service: Orthopedics;  Laterality: Right;   TONSILLECTOMY  1990   There are no problems to display for this patient.   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  Pt saw MD earlier this week.  He had x rays and pt states MD stated everything looked good.  MD removed abduction pillow.  MD note indicated pt may proceed with active range of motion as tolerated.  Pt denies any adverse effects after prior Rx.  Pt reports compliance with HEP.  Pt presented to Rx with abd pillow on sling and states his arm feels better with abd pillow.  Pt states he has pain with supine AAROM flexion when he is lowering R UE.  He has stiffness 1st thing in AM.  Pt is sleeping in the  recliner and sleeps pretty well.     PAIN:  Are you having pain? Yes NPRS:  5-6/10 in R shoulder.  Pt states it's sore.    OBJECTIVE:   TODAY'S TREATMENT:      OBSERVATION:  Pt wearing abd pillow on sling.  Pt has steri strips over incision.  No signs of infection.     Therapeutic Exercise: -Reviewed response to prior Rx, HEP compliance, current pt presentation, and pain level. -Assessed ROM -Pt performed: Seated Scapular Retraction x20 reps Pendulums cw, ccw, and f/b x 20 reps each Elbow flexion/extension AROM 2x10 reps Supine flexion AAROM with wrist grab x 10 reps  -Pt received R shoulder PROM in flexion, scaption, and ER in supine per protocol range and pt/tissue tolerance  R shoulder PROM:  flexion:  118 deg, ER:  14 deg in scapular plane.   R shoulder AAROM:  flexion:  114 deg       PATIENT EDUCATION: Education details: surgical precautions, protocol ranges, HEP, sling wear, and POC  Person educated: Patient and Spouse Education method: Explanation, Demonstration, Tactile cues, Verbal cues Education comprehension: verbalized understanding, returned demonstration, verbal cues required, and tactile cues required     HOME EXERCISE PROGRAM: Access Code: [facility] URL: [email or web address] Date: 05/07/2021 Prepared by: Zebedee Iba  Exercises Seated Scapular Retraction - 3-5 x daily -  7 x weekly - 1 sets - 10 reps Circular Shoulder Pendulum with Table Support - 3-5 x daily - 7 x weekly - 1 sets - 20 reps Standing Elbow Flexion Extension AROM - 3-5 x daily - 7 x weekly - 1 sets - 20 reps Seated Gripping Towel - 3-5 x daily - 7 x weekly - 1 sets - 20 reps Supine Shoulder Flexion AAROM - 2 x daily - 7 x weekly - 1 sets - 15 reps - 5 hold Supine Shoulder External Rotation with Dowel at 20 Degrees of Abduction - 2 x daily - 7 x weekly - 1 sets - 15 reps - 5 hold       ASSESSMENT:   CLINICAL IMPRESSION:  Pt is progressing well with R shoulder PROM per  protocol.  Pt tolerated PROM well and did require some cuing to relax R UE with PROM.  Pt requires cuing and instruction in correct form with exercises including supine flexion AAROM.  He was able to demo correct form with supine flexion AAROM with cuing and instruction.  Though he required cuing for form with pendulums, he demonstrates improved form overall.  Pt responded well to Rx having no increased pain after Rx.  Pt should benefit from continued skilled therapy in order to address ongoing goals and to assist in restoring desired level of function.   Objective impairments include decreased ROM, decreased strength, hypomobility, increased edema, increased fascial restrictions, increased muscle spasms, impaired flexibility, impaired UE functional use, improper body mechanics, postural dysfunction, and pain. These impairments are limiting patient from cleaning, community activity, driving, meal prep, occupation, laundry, yard work, and shopping. Personal factors including Age, Fitness, Time since onset of injury/illness/exacerbation, and 1-2 comorbidities:    are also affecting patient's functional outcome.         REHAB POTENTIAL: Good   CLINICAL DECISION MAKING: Stable/uncomplicated   EVALUATION COMPLEXITY: Low     GOALS:     SHORT TERM GOALS:   STG Name Target Date Goal status  1 Pt will become independent with HEP in order to demonstrate synthesis of PT education.    05/16/2021 INITIAL  2 Pt will score at least 23 pt increase on FOTO to demonstrate functional improvement in MCII and pt perceived function.     06/13/2021 INITIAL  3 Pt will be able to demonstrate fwd flexion to 130 and ER to 30 in order to demonstrate functional improvement in UE ROM benchmark.    06/13/2021 INITIAL  4 Pt will be able to demonstrate full symmetrical ROM in all planes in order to demonstrate functional improvement in UE function for progression to next phase of rehabilitation.     06/13/2021 INITIAL     LONG TERM GOALS:    LTG Name Target Date Goal status  1 Pt  will become independent with final HEP in order to demonstrate synthesis of PT education.     07/25/2021 INITIAL  2 Pt will score >/= 53 on FOTO to demonstrate functional improvement in R shoulder function for return to PLOF.     07/25/2021 INITIAL  3 Pt will be able to reach [facility] and carry/hold >5 lbs in order to demonstrate functional improvement in R UE strength for return to PLOF and exercise.    07/25/2021 INITIAL  4 Pt will be able to demonstrate full AROM in all planes in order to demonstrate functional improvement in UE function for self-care and house hold duties.    07/25/2021 INITIAL    PLAN:  PT FREQUENCY: 2x/week   PT DURATION: 12 weeks   PLANNED INTERVENTIONS: Therapeutic exercises, Therapeutic activity, Neuro Muscular re-education, Patient/Family education, Joint mobilization, Aquatic Therapy, Dry Needling, Electrical stimulation, Spinal mobilization, Cryotherapy, Moist heat, scar mobilization, Splintting, Taping, Vasopneumatic device, Traction, Ultrasound, Ionotophoresis 4mg /ml Dexamethasone, and Manual therapy   PLAN FOR NEXT SESSION: review HEP, PROM, cont per MD protocol.    III PT, DPT 05/16/21 6:42 AM

## 2021-05-15 NOTE — Telephone Encounter (Signed)
Pt submitted medical release forms. [employer] short term disability forms, and $25.00 cash payment to [employer]. Accepted 05/15/21

## 2021-05-20 ENCOUNTER — Ambulatory Visit (HOSPITAL_BASED_OUTPATIENT_CLINIC_OR_DEPARTMENT_OTHER): Payer: Medicare Other | Admitting: Physical Therapy

## 2021-05-20 ENCOUNTER — Encounter (HOSPITAL_BASED_OUTPATIENT_CLINIC_OR_DEPARTMENT_OTHER): Payer: Self-pay | Admitting: Physical Therapy

## 2021-05-20 ENCOUNTER — Other Ambulatory Visit: Payer: Self-pay

## 2021-05-20 DIAGNOSIS — M25611 Stiffness of right shoulder, not elsewhere classified: Secondary | ICD-10-CM

## 2021-05-20 DIAGNOSIS — M6281 Muscle weakness (generalized): Secondary | ICD-10-CM

## 2021-05-20 DIAGNOSIS — M25511 Pain in right shoulder: Secondary | ICD-10-CM

## 2021-05-20 NOTE — Therapy (Signed)
OUTPATIENT PHYSICAL THERAPY TREATMENT NOTE   Patient Name: Dwayne Wade MRN: [id number] DOB:25-Nov-1950, 70 y.o., male Today's Date: 05/21/2021  PCP: Chiquita Loth, PA REFERRING MD:  Huel Cote, MD  PT End of Session - 05/20/21 0950     Visit Number 6    Number of Visits 21    Date for PT Re-Evaluation 07/31/21    Authorization Type Medicare    PT Start Time 0903    PT Stop Time 0943    PT Time Calculation (min) 40 min    Activity Tolerance Patient tolerated treatment well;No increased pain    Behavior During Therapy WFL for tasks assessed/performed                Past Medical History:  Diagnosis Date   Arthritis    Hypertension    Pre-diabetes    Past Surgical History:  Procedure Laterality Date   REVERSE SHOULDER ARTHROPLASTY Right 04/30/2021   Procedure: RIGHT REVERSE SHOULDER ARTHROPLASTY;  Surgeon: Huel Cote, MD;  Location: [facility] OR;  Service: Orthopedics;  Laterality: Right;   TONSILLECTOMY  1990   There are no problems to display for this patient.   REFERRING DIAG:  M12.811 (ICD-10-CM) - Rotator cuff arthropathy of right shoulder  Surgery date 04/30/2021 Total reverse shoulder  THERAPY DIAG:  Decreased right shoulder range of motion  Acute pain of right shoulder  Muscle weakness (generalized)  PERTINENT HISTORY: Pre-diabetic, HTN   PRECAUTIONS: Shoulder- no R UE WB  SUBJECTIVE:  Pt is 2 weeks and 6 days s/p R reverse total shoulder.  He saw MD and had x rays last week.  MD stated everything looked good and  MD removed abduction pillow per Pt.  MD note indicated pt may proceed with active range of motion as tolerated.  Pt denies any adverse effects after prior Rx.  Pt reports compliance with HEP.  Pt presented to Rx with abd pillow on sling.  Pt states he has pain with supine AAROM flexion when he is lowering R UE.  He has stiffness 1st thing in AM.  Pt is sleeping in the recliner and sleeps pretty well.      Pt states his R  shoulder feels tight and heavy.  Pt's wife states pt performed ball squeezes for 100 reps 2 times per day and she thinks he may be doing too much.  PT instructed pt to not perform that many times due to possible wrist/hand soreness.  PT instructed pt in appropriate frequency and reps including his recommended frequency and reps as stated in HEP.  Pt's wife informed PT that pt mowed the lawn.  He states he didn't use his L UE much though and had no increased pain.  PT informed pt that he should not be mowing his lawn.   PAIN:  Are you having pain? Yes NPRS:  5/10 in R shoulder.     OBJECTIVE:   TODAY'S TREATMENT:      OBSERVATION:  Pt wearing abd pillow on sling.  Pt has steri strips over incision.  No signs of infection.  PT noticed increased firm mass medial mid humerus.  Pt and wife state they told MD about hard spot on medial mid humerus along bicep and MD assessed it.  Pt's wife states MD told her to massage it and the area has improved since she started massaging it.      Therapeutic Exercise: -Reviewed response to prior Rx, HEP compliance, current pt presentation, and pain level. -Assessed ROM -Pt performed:  Seated Scapular Retraction x20 reps Pendulums cw, ccw, and f/b x 20 reps each Elbow flexion/extension AROM 2x10 reps Supine flexion AAROM with wrist grab 2 x 10 reps Supine wand flexion x 10 reps  -Pt received R shoulder PROM in flexion, scaption, and ER in supine per protocol range and pt/tissue tolerance  R shoulder PROM:  ER:  20 deg in scapular plane.   R shoulder AAROM:  flexion:  121 deg       PATIENT EDUCATION: Education details: surgical precautions, protocol ranges, HEP, sling wear, and POC.  PT instructed pt and wife that he should not be cutting his grass and provided the rationale.  Instructed pt in correct form with exercises.     Person educated: Patient and Spouse Education method: Explanation, Demonstration, Tactile cues, Verbal cues Education  comprehension: verbalized understanding, returned demonstration, verbal cues required, and tactile cues required     HOME EXERCISE PROGRAM: Access Code: [facility] URL: [email or web address] Date: 05/07/2021 Prepared by: Zebedee Iba  Exercises Seated Scapular Retraction - 3-5 x daily - 7 x weekly - 1 sets - 10 reps Circular Shoulder Pendulum with Table Support - 3-5 x daily - 7 x weekly - 1 sets - 20 reps Standing Elbow Flexion Extension AROM - 3-5 x daily - 7 x weekly - 1 sets - 20 reps Seated Gripping Towel - 3-5 x daily - 7 x weekly - 1 sets - 20 reps Supine Shoulder Flexion AAROM - 2 x daily - 7 x weekly - 1 sets - 15 reps - 5 hold Supine Shoulder External Rotation with Dowel at 20 Degrees of Abduction - 2 x daily - 7 x weekly - 1 sets - 15 reps - 5 hold       ASSESSMENT:   CLINICAL IMPRESSION:  Pt continues to progress well with R shoulder ROM per protocol and demonstrates improved PROM and AAROM.  Pt tolerated PROM well and has improved with relaxing R UE with PROM.  Pt requires cuing and instruction in correct form with exercises though demonstrates improved form with exercises.  Pt tolerated supine wand flexion well.  Pt responded well to Rx having no increased pain after Rx.  Pt should benefit from continued skilled therapy in order to address ongoing goals and to assist in restoring desired level of function.   Objective impairments include decreased ROM, decreased strength, hypomobility, increased edema, increased fascial restrictions, increased muscle spasms, impaired flexibility, impaired UE functional use, improper body mechanics, postural dysfunction, and pain. These impairments are limiting patient from cleaning, community activity, driving, meal prep, occupation, laundry, yard work, and shopping. Personal factors including Age, Fitness, Time since onset of injury/illness/exacerbation, and 1-2 comorbidities:    are also affecting patient's functional outcome.          REHAB POTENTIAL: Good   CLINICAL DECISION MAKING: Stable/uncomplicated   EVALUATION COMPLEXITY: Low     GOALS:     SHORT TERM GOALS:   STG Name Target Date Goal status  1 Pt will become independent with HEP in order to demonstrate synthesis of PT education.    05/16/2021 INITIAL  2 Pt will score at least 23 pt increase on FOTO to demonstrate functional improvement in MCII and pt perceived function.     06/13/2021 INITIAL  3 Pt will be able to demonstrate fwd flexion to 130 and ER to 30 in order to demonstrate functional improvement in UE ROM benchmark.    06/13/2021 INITIAL  4 Pt will be able to demonstrate full symmetrical  ROM in all planes in order to demonstrate functional improvement in UE function for progression to next phase of rehabilitation.     06/13/2021 INITIAL    LONG TERM GOALS:    LTG Name Target Date Goal status  1 Pt  will become independent with final HEP in order to demonstrate synthesis of PT education.     07/25/2021 INITIAL  2 Pt will score >/= 53 on FOTO to demonstrate functional improvement in R shoulder function for return to PLOF.     07/25/2021 INITIAL  3 Pt will be able to reach [facility] and carry/hold >5 lbs in order to demonstrate functional improvement in R UE strength for return to PLOF and exercise.    07/25/2021 INITIAL  4 Pt will be able to demonstrate full AROM in all planes in order to demonstrate functional improvement in UE function for self-care and house hold duties.    07/25/2021 INITIAL    PLAN:    PLANNED INTERVENTIONS: Therapeutic exercises, Therapeutic activity, Neuro Muscular re-education, Patient/Family education, Joint mobilization, Aquatic Therapy, Dry Needling, Electrical stimulation, Spinal mobilization, Cryotherapy, Moist heat, scar mobilization, Splintting, Taping, Vasopneumatic device, Traction, Ultrasound, Ionotophoresis 4mg /ml Dexamethasone, and Manual therapy   PLAN FOR NEXT SESSION: review HEP, PROM, cont per MD  protocol.  Add supine wand flexion to HEP next Rx if pt tolerates well.    III PT, DPT 05/21/21 5:26 PM

## 2021-05-22 ENCOUNTER — Other Ambulatory Visit: Payer: Self-pay

## 2021-05-22 ENCOUNTER — Ambulatory Visit (HOSPITAL_BASED_OUTPATIENT_CLINIC_OR_DEPARTMENT_OTHER): Payer: Medicare Other | Admitting: Physical Therapy

## 2021-05-22 ENCOUNTER — Encounter (HOSPITAL_BASED_OUTPATIENT_CLINIC_OR_DEPARTMENT_OTHER): Payer: Self-pay | Admitting: Physical Therapy

## 2021-05-22 DIAGNOSIS — M25511 Pain in right shoulder: Secondary | ICD-10-CM

## 2021-05-22 DIAGNOSIS — M25611 Stiffness of right shoulder, not elsewhere classified: Secondary | ICD-10-CM | POA: Diagnosis not present

## 2021-05-22 DIAGNOSIS — M6281 Muscle weakness (generalized): Secondary | ICD-10-CM

## 2021-05-22 NOTE — Therapy (Signed)
OUTPATIENT PHYSICAL THERAPY TREATMENT NOTE   Patient Name: Dwayne Wade MRN: [id number] DOB:March 26, 1951, 70 y.o., male Today's Date: 05/23/2021  PCP: Chiquita Loth, PA REFERRING MD:  Huel Cote, MD  PT End of Session - 05/22/21 1114     Visit Number 7    Number of Visits 21    Date for PT Re-Evaluation 07/31/21    Authorization Type Medicare    PT Start Time 1111    PT Stop Time 1151    PT Time Calculation (min) 40 min    Activity Tolerance Patient tolerated treatment well;No increased pain    Behavior During Therapy WFL for tasks assessed/performed                Past Medical History:  Diagnosis Date   Arthritis    Hypertension    Pre-diabetes    Past Surgical History:  Procedure Laterality Date   REVERSE SHOULDER ARTHROPLASTY Right 04/30/2021   Procedure: RIGHT REVERSE SHOULDER ARTHROPLASTY;  Surgeon: Huel Cote, MD;  Location: [facility] OR;  Service: Orthopedics;  Laterality: Right;   TONSILLECTOMY  1990   There are no problems to display for this patient.   REFERRING DIAG:  M12.811 (ICD-10-CM) - Rotator cuff arthropathy of right shoulder  Surgery date 04/30/2021 Total reverse shoulder  THERAPY DIAG:  Decreased right shoulder range of motion  Acute pain of right shoulder  Muscle weakness (generalized)  PERTINENT HISTORY: Pre-diabetic, HTN   PRECAUTIONS: Shoulder- no R UE WB  SUBJECTIVE:  Pt is 3 weeks and 1 days s/p R reverse total shoulder.  He saw MD and had x rays last week.  MD stated everything looked good and  MD removed abduction pillow per Pt.  MD note indicated pt may proceed with active range of motion as tolerated.  Pt denies any adverse effects after prior Rx.  Pt reports compliance with HEP.  Pt presented to Rx with abd pillow on sling.  Pt states he has pain with supine AAROM flexion when he is lowering R UE.  He has stiffness 1st thing in AM.  Pt is sleeping in the recliner and sleeps pretty well.     Pt states his R  shoulder feels tight and heavy though not as much as it was last time.  Pt reports he decreased the amount of ball squeezes he is doing at home.  Pt's wife states that pt requires less assistance in the shower.  Pt reports he had increased pain after prior Rx.   PAIN:  Are you having pain? Yes NPRS:  6-7/10 in R shoulder.     OBJECTIVE:   TODAY'S TREATMENT:      OBSERVATION:  Pt wearing abd pillow on sling.  Pt has steri strips over incision and has decreased number of steri strips.  No signs of infection.    Therapeutic Exercise: -Reviewed response to prior Rx, HEP compliance, current pt presentation, and pain level. -Assessed ROM -Pt performed: Seated Scapular Retraction x10 reps Pendulums cw, ccw, and f/b x 20 reps each Elbow flexion/extension AROM 2x10 reps Supine wand flexion 2 x 10 reps Supine wand ER 2x10 reps -Updated HEP with supine wand flexion  -Pt received R shoulder PROM in flexion, scaption, and ER in supine per protocol range and pt/tissue tolerance  R shoulder PROM:  ER:  20 deg in scapular plane.   R shoulder AAROM:  flexion:  126 deg       PATIENT EDUCATION: Education details: Instructed pt and wife in protocol range restrictions  of ER and flexion.  Instructed pt to not perform supine fleixon > 130 deg per protocol.  Showed pt and wife what 130 deg is on L shoulder.  surgical precautions, protocol ranges, HEP, and POC.  Instructed pt in correct form with exercises including performance and set up of wand ER in supine.    Person educated: Patient and Spouse Education method: Explanation, Demonstration, Tactile cues, Verbal cues Education comprehension: verbalized understanding, returned demonstration, verbal cues required, and tactile cues required     HOME EXERCISE PROGRAM: Access Code: [facility] URL: [email or web address] Date: 05/07/2021 Prepared by: Zebedee Iba  Exercises Seated Scapular Retraction - 3-5 x daily - 7 x weekly - 1 sets -  10 reps Circular Shoulder Pendulum with Table Support - 3-5 x daily - 7 x weekly - 1 sets - 20 reps Standing Elbow Flexion Extension AROM - 3-5 x daily - 7 x weekly - 1 sets - 20 reps Seated Gripping Towel - 3-5 x daily - 7 x weekly - 1 sets - 20 reps Supine Shoulder Flexion AAROM - 2 x daily - 7 x weekly - 1 sets - 15 reps - 5 hold Supine Shoulder External Rotation with Dowel at 20 Degrees of Abduction - 2 x daily - 7 x weekly - 1 sets - 15 reps - 5 hold       ASSESSMENT:   CLINICAL IMPRESSION:  Pt continues to progress well with R shoulder PROM and AAROM per protocol.  Pt tolerated PROM well and has improved with relaxing R UE with PROM. PT provided cuing and instruction in supine wand ER and Pt demonstrated improved form today.  Pt tolerated supine wand flexion well.  PT explained protocol limits to pt and wife and they both demonstrated good understanding.  Pt responded well to Rx having no increased pain after Rx.  Pt should benefit from continued skilled therapy per protocol in order to address ongoing goals and to assist in restoring desired level of function.   Objective impairments include decreased ROM, decreased strength, hypomobility, increased edema, increased fascial restrictions, increased muscle spasms, impaired flexibility, impaired UE functional use, improper body mechanics, postural dysfunction, and pain. These impairments are limiting patient from cleaning, community activity, driving, meal prep, occupation, laundry, yard work, and shopping.            GOALS:     SHORT TERM GOALS:   STG Name Target Date Goal status  1 Pt will become independent with HEP in order to demonstrate synthesis of PT education.    05/16/2021 INITIAL  2 Pt will score at least 23 pt increase on FOTO to demonstrate functional improvement in MCII and pt perceived function.     06/13/2021 INITIAL  3 Pt will be able to demonstrate fwd flexion to 130 and ER to 30 in order to demonstrate functional  improvement in UE ROM benchmark.    06/13/2021 INITIAL  4 Pt will be able to demonstrate full symmetrical ROM in all planes in order to demonstrate functional improvement in UE function for progression to next phase of rehabilitation.     06/13/2021 INITIAL    LONG TERM GOALS:    LTG Name Target Date Goal status  1 Pt  will become independent with final HEP in order to demonstrate synthesis of PT education.     07/25/2021 INITIAL  2 Pt will score >/= 53 on FOTO to demonstrate functional improvement in R shoulder function for return to PLOF.     07/25/2021 INITIAL  3 Pt will be able to reach [facility] and carry/hold >5 lbs in order to demonstrate functional improvement in R UE strength for return to PLOF and exercise.    07/25/2021 INITIAL  4 Pt will be able to demonstrate full AROM in all planes in order to demonstrate functional improvement in UE function for self-care and house hold duties.    07/25/2021 INITIAL    PLAN:    PLANNED INTERVENTIONS: Therapeutic exercises, Therapeutic activity, Neuro Muscular re-education, Patient/Family education, Joint mobilization, Aquatic Therapy, Dry Needling, Electrical stimulation, Spinal mobilization, Cryotherapy, Moist heat, scar mobilization, Splintting, Taping, Vasopneumatic device, Traction, Ultrasound, Ionotophoresis 4mg /ml Dexamethasone, and Manual therapy   PLAN FOR NEXT SESSION: Cont with ROM per MD protocol.     III PT, DPT 05/23/21 12:34 AM

## 2021-05-27 ENCOUNTER — Encounter (HOSPITAL_BASED_OUTPATIENT_CLINIC_OR_DEPARTMENT_OTHER): Payer: Self-pay | Admitting: Physical Therapy

## 2021-05-27 ENCOUNTER — Ambulatory Visit (HOSPITAL_BASED_OUTPATIENT_CLINIC_OR_DEPARTMENT_OTHER): Payer: Medicare Other | Admitting: Physical Therapy

## 2021-05-27 ENCOUNTER — Other Ambulatory Visit: Payer: Self-pay

## 2021-05-27 DIAGNOSIS — M25611 Stiffness of right shoulder, not elsewhere classified: Secondary | ICD-10-CM

## 2021-05-27 DIAGNOSIS — M6281 Muscle weakness (generalized): Secondary | ICD-10-CM

## 2021-05-27 DIAGNOSIS — M25511 Pain in right shoulder: Secondary | ICD-10-CM

## 2021-05-27 NOTE — Therapy (Signed)
OUTPATIENT PHYSICAL THERAPY TREATMENT NOTE   Patient Name: Dwayne Wade MRN: [id number] DOB:1950-10-03, 70 y.o., male Today's Date: 05/27/2021  PCP: Chiquita Loth, PA REFERRING MD:  Huel Cote, MD  PT End of Session - 05/27/21 0859     Visit Number 8    Number of Visits 21    Date for PT Re-Evaluation 07/31/21    Authorization Type Medicare    PT Start Time 0857    PT Stop Time 0935    PT Time Calculation (min) 38 min    Activity Tolerance Patient tolerated treatment well;No increased pain    Behavior During Therapy WFL for tasks assessed/performed                Past Medical History:  Diagnosis Date   Arthritis    Hypertension    Pre-diabetes    Past Surgical History:  Procedure Laterality Date   REVERSE SHOULDER ARTHROPLASTY Right 04/30/2021   Procedure: RIGHT REVERSE SHOULDER ARTHROPLASTY;  Surgeon: Huel Cote, MD;  Location: [facility] OR;  Service: Orthopedics;  Laterality: Right;   TONSILLECTOMY  1990   There are no problems to display for this patient.   REFERRING DIAG:  M12.811 (ICD-10-CM) - Rotator cuff arthropathy of right shoulder  Surgery date 04/30/2021 Total reverse shoulder  THERAPY DIAG:  Decreased right shoulder range of motion  Acute pain of right shoulder  Muscle weakness (generalized)  PERTINENT HISTORY: Pre-diabetic, HTN   PRECAUTIONS: Shoulder- no R UE WB  SUBJECTIVE:  Pt is 3 weeks and 6 days s/p R reverse total shoulder.  He saw MD and had x rays last week.  MD stated everything looked good and  MD removed abduction pillow per Pt.  MD note indicated pt may proceed with active range of motion as tolerated.  He continues to report tightness.  Pt's wife states he is doing some time without pillow.  Pt states he had increased pain on Sunday.  Pt's wife states he carried a case of water with L UE though used his R UE to balance it on Sunday.  Pt denies any adverse effects after prior Rx.  Pt reports compliance with HEP.  Pt  reports he has a little pain with supine wand flexion though feels better than with wrist grab flexion AAROM.    PAIN:  Are you having pain? Yes NPRS:  6-7/10 in R shoulder.     OBJECTIVE:   TODAY'S TREATMENT:      OBSERVATION:  Pt wearing abd pillow on sling.  Pt has 5 steri strips left over incision.  Incision is intact and dry.  He has no signs of infection.    Therapeutic Exercise: -Reviewed response to prior Rx, HEP compliance, current pt presentation, and pain level. -Assessed ROM -Pt performed: Seated Scapular Retraction 2x10 reps Pendulums cw, ccw, and f/b x 20 reps each Elbow flexion/extension AROM 2x10 reps Supine wand flexion 2 x 10 reps Supine wand ER 2x10 reps -Reviewed HEP and showed pt and wife correct form with supine wand ER. -Pt received R shoulder PROM in flexion, scaption, and ER in supine per protocol range and pt/tissue tolerance  R shoulder PROM/AAROM:  ER:  20/15 deg in scapular plane.   R shoulder AAROM:  flexion:  120 deg       PATIENT EDUCATION: Education details: Instructed pt and wife in protocol range restrictions of ER and flexion.  Instructed pt to not perform supine fleixon > 130 and ER > 30 deg deg per protocol.  Verbal/visual/tactile  cuing for correct form with supine wand ER.  Instructed pt and wife to continue with supine wand ER at home.  Instructed pt and wife in having time in sling without abduction pillow and appropriate duration without abduction pillow.  Instructed pt to not lift a case of water and explained to pt why.  Instructed pt in correct form with exercises including performance and set up of wand ER in supine.    Person educated: Patient and Spouse Education method: Explanation, Demonstration, Tactile cues, Verbal cues Education comprehension: verbalized understanding, returned demonstration, verbal cues required, and tactile cues required     HOME EXERCISE PROGRAM: Access Code: [facility] URL:  [email or web address] Date: 05/07/2021 Prepared by: Zebedee Iba  Exercises Seated Scapular Retraction - 3-5 x daily - 7 x weekly - 1 sets - 10 reps Circular Shoulder Pendulum with Table Support - 3-5 x daily - 7 x weekly - 1 sets - 20 reps Standing Elbow Flexion Extension AROM - 3-5 x daily - 7 x weekly - 1 sets - 20 reps Seated Gripping Towel - 3-5 x daily - 7 x weekly - 1 sets - 20 reps Supine Shoulder Flexion AAROM - 2 x daily - 7 x weekly - 1 sets - 15 reps - 5 hold Supine Shoulder External Rotation with Dowel at 20 Degrees of Abduction - 2 x daily - 7 x weekly - 1 sets - 15 reps - 5 hold       ASSESSMENT:   CLINICAL IMPRESSION:  Pt had increased pain on Sunday and lifted a case of water with L UE though used R UE to steady the water.  Pt instructed he should not be lifting objects especially a case of water and also not trying to steady heavy objects with R UE.  Pt continues to progress well with R shoulder PROM and AAROM per protocol.  He did have less shoulder flexion AAROM today though is doing well overall.  He does have some tightness and limitations in ER ROM though is progressing appropriately per protocol.  Pt tolerated PROM well.  Pt demonstrated good form with supine wand ER with cuing and instruction.  PT explained protocol limits to pt and wife and they both demonstrated good understanding.  Pt responded well to Rx having no increased pain after Rx.  Pt should benefit from continued skilled therapy per protocol in order to address ongoing goals and to assist in restoring desired level of function.    Objective impairments include decreased ROM, decreased strength, hypomobility, increased edema, increased fascial restrictions, increased muscle spasms, impaired flexibility, impaired UE functional use, improper body mechanics, postural dysfunction, and pain. These impairments are limiting patient from cleaning, community activity, driving, meal prep, occupation,  laundry, yard work, and shopping.            GOALS:     SHORT TERM GOALS:   STG Name Target Date Goal status  1 Pt will become independent with HEP in order to demonstrate synthesis of PT education.    05/16/2021 INITIAL  2 Pt will score at least 23 pt increase on FOTO to demonstrate functional improvement in MCII and pt perceived function.     12 /08/2020 INITIAL  3 Pt will be able to demonstrate fwd flexion to 130 and ER to 30 in order to demonstrate functional improvement in UE ROM benchmark.    06/13/2021 INITIAL  4 Pt will be able to demonstrate full symmetrical ROM in all planes in order to demonstrate functional improvement in UE  function for progression to next phase of rehabilitation.     06/13/2021 INITIAL    LONG TERM GOALS:    LTG Name Target Date Goal status  1 Pt  will become independent with final HEP in order to demonstrate synthesis of PT education.     07/25/2021 INITIAL  2 Pt will score >/= 53 on FOTO to demonstrate functional improvement in R shoulder function for return to PLOF.     07/25/2021 INITIAL  3 Pt will be able to reach [facility] and carry/hold >5 lbs in order to demonstrate functional improvement in R UE strength for return to PLOF and exercise.    07/25/2021 INITIAL  4 Pt will be able to demonstrate full AROM in all planes in order to demonstrate functional improvement in UE function for self-care and house hold duties.    07/25/2021 INITIAL    PLAN:    PLANNED INTERVENTIONS: Therapeutic exercises, Therapeutic activity, Neuro Muscular re-education, Patient/Family education, Joint mobilization, Aquatic Therapy, Dry Needling, Electrical stimulation, Spinal mobilization, Cryotherapy, Moist heat, scar mobilization, Splintting, Taping, Vasopneumatic device, Traction, Ultrasound, Ionotophoresis 4mg /ml Dexamethasone, and Manual therapy   PLAN FOR NEXT SESSION: Cont with ROM and exercises per MD protocol.     III PT, DPT 05/27/21 4:34  PM

## 2021-05-30 ENCOUNTER — Encounter (HOSPITAL_BASED_OUTPATIENT_CLINIC_OR_DEPARTMENT_OTHER): Payer: Self-pay | Admitting: Physical Therapy

## 2021-05-30 ENCOUNTER — Other Ambulatory Visit: Payer: Self-pay

## 2021-05-30 ENCOUNTER — Ambulatory Visit (HOSPITAL_BASED_OUTPATIENT_CLINIC_OR_DEPARTMENT_OTHER): Payer: Medicare Other | Admitting: Physical Therapy

## 2021-05-30 DIAGNOSIS — M25511 Pain in right shoulder: Secondary | ICD-10-CM

## 2021-05-30 DIAGNOSIS — M6281 Muscle weakness (generalized): Secondary | ICD-10-CM

## 2021-05-30 DIAGNOSIS — M25611 Stiffness of right shoulder, not elsewhere classified: Secondary | ICD-10-CM

## 2021-05-30 NOTE — Therapy (Signed)
OUTPATIENT PHYSICAL THERAPY TREATMENT NOTE   Patient Name: Dwayne Wade MRN: [id number] DOB:[birth year], 70 y.o., male Today's Date: 05/30/2021  PCP: Chiquita Loth, PA REFERRING MD:  Huel Cote, MD  PT End of Session - 05/30/21 0853     Visit Number 9    Number of Visits 21    Date for PT Re-Evaluation 07/31/21    Authorization Type Medicare    PT Start Time 0808    PT Stop Time 0851    PT Time Calculation (min) 43 min    Activity Tolerance Patient tolerated treatment well;No increased pain    Behavior During Therapy WFL for tasks assessed/performed                 Past Medical History:  Diagnosis Date   Arthritis    Hypertension    Pre-diabetes    Past Surgical History:  Procedure Laterality Date   REVERSE SHOULDER ARTHROPLASTY Right 04/30/2021   Procedure: RIGHT REVERSE SHOULDER ARTHROPLASTY;  Surgeon: Huel Cote, MD;  Location: [facility];  Service: Orthopedics;  Laterality: Right;   TONSILLECTOMY  1990   There are no problems to display for this patient.   REFERRING DIAG:  M12.811 (ICD-10-CM) - Rotator cuff arthropathy of right shoulder  Surgery date 04/30/2021 Total reverse shoulder  THERAPY DIAG:  Decreased right shoulder range of motion  Acute pain of right shoulder  Muscle weakness (generalized)  PERTINENT HISTORY: Pre-diabetic, HTN   PRECAUTIONS: Shoulder- no R UE WB  SUBJECTIVE:  Pt is 4 weeks and 2 days s/p R reverse total shoulder.  He saw MD and had x rays last week.  MD stated everything looked good and  MD removed abduction pillow per Pt.  MD note indicated pt may proceed with active range of motion as tolerated.  He continues to report tightness.  Pt and Pt's wife states he is doing more time without pillow in the sling and also going out of the sling more at home.  Pt denies any adverse effects after prior Rx.  Pt reports compliance with HEP and states he had a little pain with HEP.  Pt states his last steri strips fell  off.    PAIN:  Are you having pain? Yes NPRS:  5-6/10 in R shoulder.     OBJECTIVE:   TODAY'S TREATMENT:      OBSERVATION:  Pt not wearing abd pillow on sling.      Therapeutic Exercise: -Reviewed response to prior Rx, HEP compliance, current pt presentation, and pain level. -Pt performed: Seated Scapular Retraction 2x10 reps Pendulums cw, ccw, and f/b x 20 reps each Elbow flexion/extension AROM 2x10 reps Supine wand flexion 2 x 10 reps Supine wand ER 2x10 reps Seated wand ER 2x10 reps Gentle Shoulder submaximal isometrics in flexion and abduction with 5 sec hold x 10 reps each.  Pt instructed they should be pain free -Pt received R shoulder PROM in flexion, scaption, and ER in supine per protocol range and pt/tissue tolerance -See Pt education provided below       PATIENT EDUCATION: Education details:  Educated in correct form and pain free submaximal isometrics.  Instructed pt and wife in protocol range restrictions of ER and flexion.  Instructed pt to not perform supine fleixon > 130 and ER > 30 deg deg per protocol.  Educated pt concerning sling wear per MD notes and MD protocol.  Instructed pt to not lift a case of water and explained to pt why.  Education provided concerning exercise  form and surgical precautions.  Instructed pt to use ice if having increased pain or soreness later.   Person educated: Patient and Spouse Education method: Explanation, Demonstration, Tactile cues, Verbal cues Education comprehension: verbalized understanding, returned demonstration, verbal cues required, and tactile cues required     HOME EXERCISE PROGRAM: Access Code: [facility] URL: [email or web address] Date: 05/07/2021 Prepared by: Zebedee Iba  Exercises Seated Scapular Retraction - 3-5 x daily - 7 x weekly - 1 sets - 10 reps Circular Shoulder Pendulum with Table Support - 3-5 x daily - 7 x weekly - 1 sets - 20 reps Standing Elbow Flexion Extension AROM - 3-5 x daily -  7 x weekly - 1 sets - 20 reps Seated Gripping Towel - 3-5 x daily - 7 x weekly - 1 sets - 20 reps Supine Shoulder Flexion AAROM - 2 x daily - 7 x weekly - 1 sets - 15 reps - 5 hold Supine Shoulder External Rotation with Dowel at 20 Degrees of Abduction - 2 x daily - 7 x weekly - 1 sets - 15 reps - 5 hold       ASSESSMENT:   CLINICAL IMPRESSION:  Pt presented to Rx without abduction pillow and has been going more in the house without the sling.  Pt tolerated PROM well and is progressing well with R shoulder ROM.  Pt does have tightness with ER ROM though is improving.  Progressed exercises per protocol and Pt performed exercises well with instruction and cuing for correct form and positioning.  Pt demonstrates good form with supine wand ER and improved form with supine wand flexion.  He responded well to Rx reporting "a little increase in pain" after Rx.  Pt should benefit from continued skilled therapy per protocol in order to address ongoing goals and to assist in restoring desired level of function.      Objective impairments include decreased ROM, decreased strength, hypomobility, increased edema, increased fascial restrictions, increased muscle spasms, impaired flexibility, impaired UE functional use, improper body mechanics, postural dysfunction, and pain. These impairments are limiting patient from cleaning, community activity, driving, meal prep, occupation, laundry, yard work, and shopping.            GOALS:     SHORT TERM GOALS:   STG Name Target Date Goal status  1 Pt will become independent with HEP in order to demonstrate synthesis of PT education.    05/16/2021 INITIAL  2 Pt will score at least 23 pt increase on FOTO to demonstrate functional improvement in MCII and pt perceived function.     06/13/2021 INITIAL  3 Pt will be able to demonstrate fwd flexion to 130 and ER to 30 in order to demonstrate functional improvement in UE ROM benchmark.    06/13/2021 INITIAL  4 Pt  will be able to demonstrate full symmetrical ROM in all planes in order to demonstrate functional improvement in UE function for progression to next phase of rehabilitation.     06/13/2021 INITIAL    LONG TERM GOALS:    LTG Name Target Date Goal status  1 Pt  will become independent with final HEP in order to demonstrate synthesis of PT education.     07/25/2021 INITIAL  2 Pt will score >/= 53 on FOTO to demonstrate functional improvement in R shoulder function for return to PLOF.     07/25/2021 INITIAL  3 Pt will be able to reach [facility] and carry/hold >5 lbs in order to demonstrate functional improvement in R UE  strength for return to PLOF and exercise.    07/25/2021 INITIAL  4 Pt will be able to demonstrate full AROM in all planes in order to demonstrate functional improvement in UE function for self-care and house hold duties.    07/25/2021 INITIAL    PLAN:    PLANNED INTERVENTIONS: Therapeutic exercises, Therapeutic activity, Neuro Muscular re-education, Patient/Family education, Joint mobilization, Aquatic Therapy, Dry Needling, Electrical stimulation, Spinal mobilization, Cryotherapy, Moist heat, scar mobilization, Splintting, Taping, Vasopneumatic device, Traction, Ultrasound, Ionotophoresis 4mg /ml Dexamethasone, and Manual therapy   PLAN FOR NEXT SESSION: PN next visit.  Cont with ROM and exercises per MD protocol.  Attempt supine flexion AROM per protocol next visit if pt tolerates it.     III PT, DPT 05/30/21 9:12 AM

## 2021-06-03 ENCOUNTER — Ambulatory Visit (HOSPITAL_BASED_OUTPATIENT_CLINIC_OR_DEPARTMENT_OTHER): Payer: Medicare Other | Admitting: Physical Therapy

## 2021-06-03 ENCOUNTER — Encounter (HOSPITAL_BASED_OUTPATIENT_CLINIC_OR_DEPARTMENT_OTHER): Payer: Self-pay | Admitting: Physical Therapy

## 2021-06-03 ENCOUNTER — Other Ambulatory Visit: Payer: Self-pay

## 2021-06-03 DIAGNOSIS — M25511 Pain in right shoulder: Secondary | ICD-10-CM

## 2021-06-03 DIAGNOSIS — M6281 Muscle weakness (generalized): Secondary | ICD-10-CM

## 2021-06-03 DIAGNOSIS — M25611 Stiffness of right shoulder, not elsewhere classified: Secondary | ICD-10-CM | POA: Diagnosis not present

## 2021-06-03 NOTE — Therapy (Signed)
OUTPATIENT PHYSICAL THERAPY TREATMENT NOTE/PROGRESS NOTE   Progress Note Reporting Period 05/02/21 to 06/03/21  See note below for Objective Data and Assessment of Progress/Goals.       Patient Name: Dwayne Wade MRN: [id number] DOB:Mar 17, 1951, 70 y.o., male Today's Date: 06/03/2021  PCP: Alan Ripper, PA REFERRING MD:  Vanetta Mulders, MD  PT End of Session - 06/03/21 [phone]     Visit Number 10    Number of Visits 21    Date for PT Re-Evaluation 07/31/21    Authorization Type Medicare    PT Start Time 0856    PT Stop Time 0938    PT Time Calculation (min) 42 min    Activity Tolerance Patient tolerated treatment well    Behavior During Therapy [facility] for tasks assessed/performed                  Past Medical History:  Diagnosis Date   Arthritis    Hypertension    Pre-diabetes    Past Surgical History:  Procedure Laterality Date   REVERSE SHOULDER ARTHROPLASTY Right 04/30/2021   Procedure: RIGHT REVERSE SHOULDER ARTHROPLASTY;  Surgeon: Vanetta Mulders, MD;  Location: [place];  Service: Orthopedics;  Laterality: Right;   TONSILLECTOMY  1990   There are no problems to display for this patient.   REFERRING DIAG:  M12.811 (ICD-10-CM) - Rotator cuff arthropathy of right shoulder  Surgery date 04/30/2021 Total reverse shoulder  THERAPY DIAG:  Decreased right shoulder range of motion  Acute pain of right shoulder  Muscle weakness (generalized)  PERTINENT HISTORY: Pre-diabetic, HTN   PRECAUTIONS: Shoulder- no R UE WB  SUBJECTIVE:  Pt is 4 weeks and 6 days s/p R reverse total shoulder.  He continues to report tightness.  Pt and Pt's wife states he is doing more time without pillow in the sling and also going out of the sling more at home.  Pt states he felt good after prior Rx.  He had some soreness, though no pain.  Pt reports compliance with HEP and states he had a little pain with supine wand ER.    Pt states his granddaughter was trying to help  him though bumped his shoulder.  Pt reports he was sore in shoulder after she hit his shoulder.    Pt reports he is able to take a shower and can almost dress himself.  Pt is limited with performing ADLs and IADLs and is limited per protocol.       PAIN:  Are you having pain? Yes NRPS:  4-5/10 current, Worst pain 5-6/10, 4/10 best Location: R shoulder.     OBJECTIVE:   TODAY'S TREATMENT:      OBSERVATION:  Pt wearing sling and not wearing abd pillow on sling.  Incision intact, closed, and healing well.  No drainage.  All steri strips have fallen off.      PALPATION:  TTP t/o shoulder including ant, lat, and post.  Pt also tender to palpate t/o R UE in biceps and triceps.    R SHOULDER ROM:  PROM/AAROM:  Flexion:  125/120 deg,  ER:  27/14 deg    FOTO:  36     Therapeutic Exercise: -Reviewed response to prior Rx, HEP compliance, current pt presentation, and pain level. -Pt performed: Pendulums cw, ccw, and f/b x 20 reps each Elbow flexion/extension AROM 2x10 reps Supine wand flexion 2 x 10 reps Supine wand ER 2x10 reps Seated wand ER 1x15 reps Gentle Shoulder submaximal isometrics in flexion and abduction  with 5 sec hold x 10 reps each.  Pt instructed they should be pain free -Pt received R shoulder PROM in flexion and ER in supine per protocol range and pt/tissue tolerance -See Pt education provided below       PATIENT EDUCATION: Education details:  Educated in correct form and pain free submaximal isometrics.  Instructed pt and wife in protocol range restrictions of ER and flexion.  Instructed pt to not perform supine fleixon > 130 and ER > 30 deg deg per protocol.  Educated pt concerning sling wear per MD notes.  Education provided concerning exercise form and surgical precautions.  Objective findings and progress.  He required cuing for correct form with supine wand ER. Person educated: Patient and Spouse Education method: Explanation, Demonstration, Tactile cues, Verbal  cues Education comprehension: verbalized understanding, returned demonstration, verbal cues required, and tactile cues required     HOME EXERCISE PROGRAM: Access Code: [facility] URL: [email or web address] Date: 05/07/2021 Prepared by: Daleen Bo  Exercises Seated Scapular Retraction - 3-5 x daily - 7 x weekly - 1 sets - 10 reps Circular Shoulder Pendulum with Table Support - 3-5 x daily - 7 x weekly - 1 sets - 20 reps Standing Elbow Flexion Extension AROM - 3-5 x daily - 7 x weekly - 1 sets - 20 reps Seated Gripping Towel - 3-5 x daily - 7 x weekly - 1 sets - 20 reps Supine Shoulder Flexion AAROM - 2 x daily - 7 x weekly - 1 sets - 15 reps - 5 hold Supine Shoulder External Rotation with Dowel at 20 Degrees of Abduction - 2 x daily - 7 x weekly - 1 sets - 15 reps - 5 hold       ASSESSMENT:   CLINICAL IMPRESSION:  Pt is 4 weeks and 6 days s/p R reverse total shoulder replacement and is making good progress.  He is progressing appropriately with L shoulder ROM per protocol.  He does have tightness and limitations with ER though is progressing.  He is approaching PROM limits per protocol.  He continues to wear the sling though is going longer without the abduction pillow and increasing time without the sling at home.  Pt reports improved performance of self care activities including bathing and dressing.  He is limited per protocol and has expected limitations with ADLs and IADLs and unable to perform reaching activities.  Pt's FOTO score improved from 4 initially to 37 currently.  He responded well to Rx having no c/o's after Rx.  Pt should benefit from continued skilled therapy per protocol in order to address ongoing goals and to assist in restoring desired level of function.      Objective impairments include decreased ROM, decreased strength, hypomobility, increased edema, increased fascial restrictions, increased muscle spasms, impaired flexibility, impaired UE functional  use, improper body mechanics, postural dysfunction, and pain. These impairments are limiting patient from cleaning, community activity, driving, meal prep, occupation, laundry, yard work, and shopping.            GOALS:     SHORT TERM GOALS:   STG Name Target Date Goal status  1 Pt will become independent with HEP in order to demonstrate synthesis of PT education.    05/16/2021 GOAL MET  2 Pt will score at least 23 pt increase on FOTO to demonstrate functional improvement in MCII and pt perceived function.     06/13/2021 PARTIALLY MET; Pt had 22 pt increase  3 Pt will be able to demonstrate  fwd flexion to 130 and ER to 30 in order to demonstrate functional improvement in UE ROM benchmark.    06/13/2021 PROGRESSING  4 Pt will be able to demonstrate full symmetrical ROM in all planes in order to demonstrate functional improvement in UE function for progression to next phase of rehabilitation.     06/13/2021 INITIAL    LONG TERM GOALS:    LTG Name Target Date Goal status  1 Pt  will become independent with final HEP in order to demonstrate synthesis of PT education.     07/25/2021 INITIAL  2 Pt will score >/= 53 on FOTO to demonstrate functional improvement in R shoulder function for return to PLOF.     07/25/2021 INITIAL  3 Pt will be able to reach [facility] and carry/hold >5 lbs in order to demonstrate functional improvement in R UE strength for return to PLOF and exercise.    07/25/2021 INITIAL  4 Pt will be able to demonstrate full AROM in all planes in order to demonstrate functional improvement in UE function for self-care and house hold duties.    07/25/2021 INITIAL    PLAN: FREQUENCY:  2 Times per week  DURATION:  7-8 WEEKS   PLANNED INTERVENTIONS: Therapeutic exercises, Therapeutic activity, Neuro Muscular re-education, Patient/Family education, Joint mobilization, Aquatic Therapy, Dry Needling, Electrical stimulation, Spinal mobilization, Cryotherapy, Moist heat, scar mobilization,  Splintting, Taping, Vasopneumatic device, Traction, Ultrasound, Ionotophoresis 52m/ml Dexamethasone, and Manual therapy   PLAN FOR NEXT SESSION: Cont with ROM and exercises per MD protocol.  Attempt supine flexion AROM per protocol next visit if pt tolerates it.     RSelinda MichaelsIII PT, DPT 06/03/21 9:43 PM

## 2021-06-09 ENCOUNTER — Other Ambulatory Visit (HOSPITAL_BASED_OUTPATIENT_CLINIC_OR_DEPARTMENT_OTHER): Payer: Self-pay | Admitting: Orthopaedic Surgery

## 2021-06-09 DIAGNOSIS — M12811 Other specific arthropathies, not elsewhere classified, right shoulder: Secondary | ICD-10-CM

## 2021-06-10 ENCOUNTER — Ambulatory Visit (INDEPENDENT_AMBULATORY_CARE_PROVIDER_SITE_OTHER): Payer: Medicare Other | Admitting: Orthopaedic Surgery

## 2021-06-10 ENCOUNTER — Telehealth: Payer: Self-pay | Admitting: Orthopaedic Surgery

## 2021-06-10 ENCOUNTER — Other Ambulatory Visit: Payer: Self-pay

## 2021-06-10 ENCOUNTER — Ambulatory Visit (HOSPITAL_BASED_OUTPATIENT_CLINIC_OR_DEPARTMENT_OTHER): Payer: Medicare Other | Admitting: Physical Therapy

## 2021-06-10 ENCOUNTER — Encounter (HOSPITAL_BASED_OUTPATIENT_CLINIC_OR_DEPARTMENT_OTHER): Payer: Self-pay | Admitting: Physical Therapy

## 2021-06-10 DIAGNOSIS — M12811 Other specific arthropathies, not elsewhere classified, right shoulder: Secondary | ICD-10-CM

## 2021-06-10 DIAGNOSIS — M25511 Pain in right shoulder: Secondary | ICD-10-CM

## 2021-06-10 DIAGNOSIS — M25611 Stiffness of right shoulder, not elsewhere classified: Secondary | ICD-10-CM | POA: Diagnosis not present

## 2021-06-10 DIAGNOSIS — M6281 Muscle weakness (generalized): Secondary | ICD-10-CM

## 2021-06-10 NOTE — Patient Instructions (Signed)
Disability and Out-of-Work Paperwork  If you would like to file disability (short term and/or and long term), FMLA, or other out-of-work paperwork, please mail, hand deliver, or fax the paperwork to our main office:   [facility]  [address]  [place], [place] [address] Phone: [phone] Fax: [phone]  We are unable to complete these forms in our [facility] satellite office and want to ensure your paperwork is filed in an appropriate and proper manner.   Thank you for understanding.    

## 2021-06-10 NOTE — Telephone Encounter (Signed)
Pt submitted extended short term disability forms, medical release forms, and $25.00 cash payment to [employer]. Accepted 06/10/21

## 2021-06-10 NOTE — Therapy (Signed)
OUTPATIENT PHYSICAL THERAPY TREATMENT NOTE      Patient Name: Dwayne Wade MRN: [id number] DOB:10-May-1951, 70 y.o., male 55 Date: 06/11/2021  PCP: Alan Ripper, PA REFERRING MD:  Vanetta Mulders, MD  PT End of Session - 06/10/21 1022     Visit Number 11    Number of Visits 21    Date for PT Re-Evaluation 07/31/21    Authorization Type Medicare    PT Start Time 1019    PT Stop Time 1104    PT Time Calculation (min) 45 min    Activity Tolerance Patient tolerated treatment well    Behavior During Therapy WFL for tasks assessed/performed                  Past Medical History:  Diagnosis Date   Arthritis    Hypertension    Pre-diabetes    Past Surgical History:  Procedure Laterality Date   REVERSE SHOULDER ARTHROPLASTY Right 04/30/2021   Procedure: RIGHT REVERSE SHOULDER ARTHROPLASTY;  Surgeon: Vanetta Mulders, MD;  Location: [place];  Service: Orthopedics;  Laterality: Right;   TONSILLECTOMY  1990   There are no problems to display for this patient.   REFERRING DIAG:  M12.811 (ICD-10-CM) - Rotator cuff arthropathy of right shoulder  Surgery date 04/30/2021 Total reverse shoulder  THERAPY DIAG:  Decreased right shoulder range of motion  Acute pain of right shoulder  Muscle weakness (generalized)  PERTINENT HISTORY: Pre-diabetic, HTN   PRECAUTIONS: Shoulder- no R UE WB  SUBJECTIVE:  Pt is 5 weeks and 6 days s/p R reverse total shoulder.  Pt reports compliance with HEP.  Pt reports he is able to take a shower and can almost dress himself.  Pt states he can eat with R UE.  Pt is limited with performing ADLs and IADLs and is limited per protocol.  Pt is unable to perform lifting.  Pt saw MD today and he removed the sling.  Pt reports MD was pleased with progress.  Pt will return to MD in 6 weeks.  Pt unable to sleep in the bed and informed MD today.  MD informed pt that it will be a progression getting back to bed.  Pt's wife also states they  informed MD of trembling in UE which MD stated that will also progressively improve.  Pt denies any adverse effects after prior Rx.  Pt states he has a hx of L shoulder pain also and his L shoulder will be x rayed next visit.     PAIN:  Are you having pain? Yes NRPS:  3-3.5/10 current, Worst pain 5-6/10 Location: R shoulder.     OBJECTIVE:   TODAY'S TREATMENT:      OBSERVATION:  Pt not wearing sling. Incision intact, closed, and healing well.  No drainage.  All steri strips have fallen off.         R SHOULDER ROM:  ROM:  PROM  ER:  30.  AAROM:  ER:  26 deg.  AROM:  Flexion in supine:  112 deg, ER:  22 deg         Therapeutic Exercise: -Reviewed response to prior Rx, HEP compliance, current pt presentation, and pain level. -Pt performed: Pendulums cw, ccw, and f/b x 20 reps each Supine wand flexion x 10 reps Supine wand ER x 10 reps Supine shoulder flexion AROM 2x10 reps Supine ER AROM 2x10 reps Supine shoulder flexion AROM 2x10 reps Gentle Shoulder submaximal isometrics in flexion, abduction, and extension with arm at side with  5 sec hold x 10 reps each.  Pt instructed they should be pain free. -Pt received R shoulder PROM in flexion and ER in supine per protocol range and pt/tissue tolerance -See Pt education provided below       PATIENT EDUCATION: Education details:  Updated HEP with supine flexion AROM and removed supine clasped wrist flex AAROM.  Educated pt and wife with correct form, appropriate frequency, and appropriate ROM.  Instructed them that pt should not perform into a painful or tight range.     Educated in correct form and pain free submaximal isometrics.  Instructed pt and wife in protocol range restrictions of ER and flexion. Education provided concerning exercise form and surgical precautions.  Objective findings and progress.   Person educated: Patient and Spouse Education method: Explanation, Demonstration, Tactile cues, Verbal cues, handout Education  comprehension: verbalized understanding, returned demonstration, verbal cues required, and tactile cues required     HOME EXERCISE PROGRAM: Access Code: [facility] URL: [email or web address] Date: 06/10/2021 Prepared by: Ronny Flurry  Exercises Seated Scapular Retraction - 3-5 x daily - 7 x weekly - 1 sets - 10 reps Circular Shoulder Pendulum with Table Support - 3-5 x daily - 7 x weekly - 1 sets - 20 reps Standing Elbow Flexion Extension AROM - 3-5 x daily - 7 x weekly - 1 sets - 20 reps Seated Gripping Towel - 3-5 x daily - 7 x weekly - 1 sets - 20 reps Supine Shoulder External Rotation with Dowel at 20 Degrees of Abduction - 2 x daily - 7 x weekly - 1 sets - 15 reps - 5 hold Supine Shoulder Wand flexion AAROM - 2 x daily - 7 x weekly - 1-2 sets - 10 reps Supine Shoulder Flexion AROM - 2 x daily - 7 x weekly - 2 sets - 10 reps        ASSESSMENT:   CLINICAL IMPRESSION:  Pt is 5 weeks and 6 days s/p R reverse total shoulder replacement and is making good progress.  He presents to Rx without sling as MD removed it today.  He continues to have tightness in L shoulder flexion ROM though demonstrates improved ROM with increased reps of PROM.  He demonstrates improved ER ROM today and achieved protocol limits passively.  Progressed exercises per protocol and Pt was able to perform supine shoulder flexion AROM.  Pt reports improved performance of self care activities including bathing and dressing.  He is limited per protocol and has expected limitations with ADLs and IADLs.  He responded well to Rx having no c/o's after Rx.  Pt should benefit from continued skilled therapy per protocol in order to address ongoing goals and to assist in restoring desired level of function.           Objective impairments include decreased ROM, decreased strength, hypomobility, increased edema, increased fascial restrictions, increased muscle spasms, impaired flexibility, impaired UE functional  use, improper body mechanics, postural dysfunction, and pain. These impairments are limiting patient from cleaning, community activity, driving, meal prep, occupation, laundry, yard work, and shopping.            GOALS:     SHORT TERM GOALS:   STG Name Target Date Goal status  1 Pt will become independent with HEP in order to demonstrate synthesis of PT education.    05/16/2021 GOAL MET  2 Pt will score at least 23 pt increase on FOTO to demonstrate functional improvement in MCII and pt perceived function.  06/13/2021 PARTIALLY MET; Pt had 22 pt increase  3 Pt will be able to demonstrate fwd flexion to 130 and ER to 30 in order to demonstrate functional improvement in UE ROM benchmark.    06/13/2021 PROGRESSING  4 Pt will be able to demonstrate full symmetrical ROM in all planes in order to demonstrate functional improvement in UE function for progression to next phase of rehabilitation.     06/13/2021 INITIAL    LONG TERM GOALS:    LTG Name Target Date Goal status  1 Pt  will become independent with final HEP in order to demonstrate synthesis of PT education.     07/25/2021 INITIAL  2 Pt will score >/= 53 on FOTO to demonstrate functional improvement in R shoulder function for return to PLOF.     07/25/2021 INITIAL  3 Pt will be able to reach [facility] and carry/hold >5 lbs in order to demonstrate functional improvement in R UE strength for return to PLOF and exercise.    07/25/2021 INITIAL  4 Pt will be able to demonstrate full AROM in all planes in order to demonstrate functional improvement in UE function for self-care and house hold duties.    07/25/2021 INITIAL    PLAN: FREQUENCY:  2 Times per week  DURATION:  7-8 WEEKS   PLANNED INTERVENTIONS: Therapeutic exercises, Therapeutic activity, Neuro Muscular re-education, Patient/Family education, Joint mobilization, Aquatic Therapy, Dry Needling, Electrical stimulation, Spinal mobilization, Cryotherapy, Moist heat, scar mobilization,  Splintting, Taping, Vasopneumatic device, Traction, Ultrasound, Ionotophoresis 39m/ml Dexamethasone, and Manual therapy   PLAN FOR NEXT SESSION: Cont with ROM and exercises per MD protocol.  Attempt supine flexion AROM per protocol next visit if pt tolerates it.     RSelinda MichaelsIII PT, DPT 06/11/21 11:02 AM

## 2021-06-10 NOTE — Progress Notes (Signed)
Post Operative Evaluation    Procedure/Date of Surgery: Right reverse shoulder arthroplasty performed 05/01/2019  Interval History:    Dwayne Wade presents today 6-week status post right reverse shoulder arthroplasty.  He has been doing remarkably well.  Pain continues to be much improved.   PMH/PSH/Family History/Social History/Meds/Allergies:    Past Medical History:  Diagnosis Date   Arthritis    Hypertension    Pre-diabetes    Past Surgical History:  Procedure Laterality Date   REVERSE SHOULDER ARTHROPLASTY Right 04/30/2021   Procedure: RIGHT REVERSE SHOULDER ARTHROPLASTY;  Surgeon: Dwayne Cote, MD;  Location: [facility];  Service: Orthopedics;  Laterality: Right;   TONSILLECTOMY  1990   Social History   Socioeconomic History   Marital status: Married    Spouse name: Not on file   Number of children: Not on file   Years of education: Not on file   Highest education level: Not on file  Occupational History   Not on file  Tobacco Use   Smoking status: Never   Smokeless tobacco: Never  Vaping Use   Vaping Use: Never used  Substance and Sexual Activity   Alcohol use: Not Currently   Drug use: Never   Sexual activity: Yes    Birth control/protection: None  Other Topics Concern   Not on file  Social History Narrative   Not on file   Social Determinants of Health   Financial Resource Strain: Not on file  Food Insecurity: Not on file  Transportation Needs: Not on file  Physical Activity: Not on file  Stress: Not on file  Social Connections: Not on file   No family history on file. Allergies  Allergen Reactions   Ibuprofen Nausea Only   Current Outpatient Medications  Medication Sig Dispense Refill   amLODipine (NORVASC) 5 MG tablet Take 5 mg by mouth in the morning.     aspirin EC 325 MG tablet Take 1 tablet (325 mg total) by mouth daily. 30 tablet 0   Cholecalciferol (VITAMIN D3) 50 MCG (2000 UT) TABS Take 2,000 Units by  mouth every evening.     Dapagliflozin Propanediol (FARXIGA PO) Take by mouth.     DEXILANT 60 MG capsule Take 60 mg by mouth every evening.     hydrOXYzine (ATARAX/VISTARIL) 25 MG tablet Take 25 mg by mouth 4 (four) times daily as needed for anxiety.     losartan-hydrochlorothiazide (HYZAAR) 100-25 MG tablet Take 1 tablet by mouth every evening.     magnesium oxide (MAG-OX) 400 MG tablet Take 400 mg by mouth every evening.     meclizine (ANTIVERT) 25 MG tablet Take 25 mg by mouth every 8 (eight) hours as needed for dizziness.     montelukast (SINGULAIR) 10 MG tablet Take 10 mg by mouth daily as needed (allergies/respiratory issues).     Multiple Vitamin (MULTIVITAMIN WITH MINERALS) TABS tablet Take 1 tablet by mouth every evening.     oxycodone (OXY-IR) 5 MG capsule Take 1 capsule (5 mg total) by mouth every 4 (four) hours as needed (severe pain). 20 capsule 0   oxyCODONE (ROXICODONE) 15 MG immediate release tablet Take 15 mg by mouth every 4 (four) hours as needed for pain.     PROLATE 10-300 MG tablet Take 1 tablet by mouth in the morning, at noon, and at bedtime.  simvastatin (ZOCOR) 40 MG tablet Take 40 mg by mouth at bedtime.     traMADol (ULTRAM) 50 MG tablet Take 50 mg by mouth in the morning, at noon, in the evening, and at bedtime.     Vitamin E 450 MG (1000 UT) CAPS Take 1,000 Units by mouth every evening.     zolpidem (AMBIEN) 10 MG tablet Take 10 mg by mouth at bedtime.     Current Facility-Administered Medications  Medication Dose Route Frequency Provider Last Rate Last Admin   oxyCODONE (Oxy IR/ROXICODONE) immediate release tablet 5 mg  5 mg Oral Q4H PRN Dwayne Cote, MD       No results found.  Review of Systems:   A ROS was performed including pertinent positives and negatives as documented in the HPI.   Musculoskeletal Exam:    There were no vitals taken for this visit. Active range of motion is 100 degrees forward elevation external rotation at the side is to 45  degrees.  Sensation is intact in all distributions of the right arm.  He is able to flex and extend at the right elbow  Imaging:    None  I personally reviewed and interpreted the radiographs.   Assessment:   70 year old male with right reverse shoulder arthroplasty 6 weeks postop overall doing.  He needs to advance very well.  His pain is very well controlled.  He may be out of his sling at all times at this time.  He will continue advance per protocol.  He will be out of work for an additional 6 weeks while he continues to rehabilitate the right shoulder as there is now some weakness.  Plan :    -Return to clinic in 6 weeks   I personally saw and evaluated the patient, and participated in the management and treatment plan.  Dwayne Cote, MD Attending Physician, Orthopedic Surgery  This document was dictated using Dragon voice recognition software. A reasonable attempt at proof reading has been made to minimize errors.

## 2021-06-12 ENCOUNTER — Encounter (HOSPITAL_BASED_OUTPATIENT_CLINIC_OR_DEPARTMENT_OTHER): Payer: Self-pay | Admitting: Physical Therapy

## 2021-06-12 ENCOUNTER — Ambulatory Visit (HOSPITAL_BASED_OUTPATIENT_CLINIC_OR_DEPARTMENT_OTHER): Payer: Medicare Other | Attending: Orthopaedic Surgery | Admitting: Physical Therapy

## 2021-06-12 ENCOUNTER — Other Ambulatory Visit: Payer: Self-pay

## 2021-06-12 DIAGNOSIS — M6281 Muscle weakness (generalized): Secondary | ICD-10-CM | POA: Insufficient documentation

## 2021-06-12 DIAGNOSIS — M25611 Stiffness of right shoulder, not elsewhere classified: Secondary | ICD-10-CM | POA: Diagnosis not present

## 2021-06-12 DIAGNOSIS — M25511 Pain in right shoulder: Secondary | ICD-10-CM | POA: Diagnosis present

## 2021-06-12 NOTE — Therapy (Signed)
OUTPATIENT PHYSICAL THERAPY TREATMENT NOTE      Patient Name: Dwayne Wade MRN: [id number] DOB:1951/06/06, 70 y.o., male 2 Date: 06/12/2021  PCP: Alan Ripper, PA REFERRING MD:  Vanetta Mulders, MD  PT End of Session - 06/12/21 [phone]     Visit Number 12    Number of Visits 21    Date for PT Re-Evaluation 07/31/21    Authorization Type Medicare    PT Start Time 0801    PT Stop Time 0846    PT Time Calculation (min) 45 min    Activity Tolerance Patient tolerated treatment well    Behavior During Therapy [facility] for tasks assessed/performed                   Past Medical History:  Diagnosis Date   Arthritis    Hypertension    Pre-diabetes    Past Surgical History:  Procedure Laterality Date   REVERSE SHOULDER ARTHROPLASTY Right 04/30/2021   Procedure: RIGHT REVERSE SHOULDER ARTHROPLASTY;  Surgeon: Vanetta Mulders, MD;  Location: [place];  Service: Orthopedics;  Laterality: Right;   TONSILLECTOMY  1990   There are no problems to display for this patient.   REFERRING DIAG:  M12.811 (ICD-10-CM) - Rotator cuff arthropathy of right shoulder  Surgery date 04/30/2021 Total reverse shoulder  THERAPY DIAG:  Decreased right shoulder range of motion  Acute pain of right shoulder  Muscle weakness (generalized)  PERTINENT HISTORY: Pre-diabetic, HTN   PRECAUTIONS: Shoulder- no R UE WB  SUBJECTIVE:  Pt is 6 weeks and 1 day s/p R reverse total shoulder.  Pt reports compliance with HEP.  Pt reports he is able to take a shower and can almost dress himself.  Pt states he can eat with R UE.  Pt is limited with performing ADLs and IADLs and is limited per protocol.  Pt is unable to perform lifting.  Pt states he had increased pain after prior Rx which lasted about 3 hours.  Pt took tramadol.  Pt reports he slept in the bed last night.  He had 3/10 pain in the bed but was able to sleep in the bed.  Pt's wife states he uses Tylenol at night which improves his pain.   Pt is not using sling except when in a large crowd.     PAIN:  Are you having pain? Yes NRPS:  3-4/10 current, Worst pain 5-6/10 Location: R shoulder.     OBJECTIVE:   TODAY'S TREATMENT:      OBSERVATION:  Pt not wearing sling. Incision intact, closed, and healing well.  No drainage.        Therapeutic Exercise: -Reviewed response to prior Rx, HEP compliance, current pt presentation, and pain level. -Pt performed: Pendulums cw, ccw, and f/b x 20 reps each Supine shoulder flexion AROM 2x10 reps Supine serratus punches 2x10 reps Supine ER AROM 2x10 reps Gentle Shoulder submaximal isometrics in flexion, abduction, and extension with arm at side with 5 sec hold x 10 reps each.  Pt instructed they should be pain free. -Pt received R shoulder PROM in flexion and ER in supine and gentle abduction per protocol range and pt/tissue tolerance -See Pt education provided below.  Updated HEP and gave pt a handout.       PATIENT EDUCATION: Education details:  Post op protocol and restrictions.  Updated HEP with submaximal isometrics.  Pt received a HEP handout and was educated in correct form and appropriate frequency.  Pt instructed isometrics should be pain free.  Instructed pt and wife in protocol range restrictions of ER and flexion. Education provided concerning exercise form. Person educated: Patient and Spouse Education method: Explanation, Demonstration, Tactile cues, Verbal cues, handout Education comprehension: verbalized understanding, returned demonstration, verbal cues required, and tactile cues required     HOME EXERCISE PROGRAM: Access Code: [facility] URL: [email or web address] Date: 06/12/2021 Prepared by: Ronny Flurry  Exercises Seated Scapular Retraction - 3-5 x daily - 7 x weekly - 1 sets - 10 reps Circular Shoulder Pendulum with Table Support - 3-5 x daily - 7 x weekly - 1 sets - 20 reps Standing Elbow Flexion Extension AROM - 3-5 x daily - 7 x  weekly - 1 sets - 20 reps Seated Gripping Towel - 3-5 x daily - 7 x weekly - 1 sets - 20 reps Supine Shoulder External Rotation with Dowel at 20 Degrees of Abduction - 2 x daily - 7 x weekly - 1 sets - 15 reps - 5 hold Supine Shoulder Wand flexion AAROM - 2 x daily - 7 x weekly - 1-2 sets - 10 reps Supine Shoulder Flexion AROM - 2 x daily - 7 x weekly - 2 sets - 10 reps Isometric Shoulder Flexion at Wall - 1 x daily - 5 x weekly - 1-2 sets - 10 reps - 5 seconds hold Isometric Shoulder Abduction at Wall - 1 x daily - 5 x weekly - 1-2 sets - 10 reps - 5 seconds hold Isometric Shoulder Extension at Wall - 1 x daily - 7 x weekly - 1-2 sets - 10 reps - 5 seconds hold         ASSESSMENT:   CLINICAL IMPRESSION:  Pt is 6 weeks and 1 day s/p R reverse total shoulder replacement and is making good progress.  Pt is progressing appropriately with protocol and is improving with AROM in gravity minimized positions.  Pt is not using sling except in crowds.  He continues to have tightness in L shoulder flexion ROM though demonstrates improved ROM with increased reps of PROM.  He is limited per protocol and has expected limitations with ADLs and IADLs though is improving with ADLs/self care activities.  He responded well to Rx having no c/o's after Rx.  Pt should benefit from continued skilled therapy per protocol in order to address ongoing goals and to assist in restoring desired level of function.           Objective impairments include decreased ROM, decreased strength, hypomobility, increased edema, increased fascial restrictions, increased muscle spasms, impaired flexibility, impaired UE functional use, improper body mechanics, postural dysfunction, and pain. These impairments are limiting patient from cleaning, community activity, driving, meal prep, occupation, laundry, yard work, and shopping.            GOALS:     SHORT TERM GOALS:   STG Name Target Date Goal status  1 Pt will become  independent with HEP in order to demonstrate synthesis of PT education.    05/16/2021 GOAL MET  2 Pt will score at least 23 pt increase on FOTO to demonstrate functional improvement in MCII and pt perceived function.     06/13/2021 PARTIALLY MET; Pt had 22 pt increase  3 Pt will be able to demonstrate fwd flexion to 130 and ER to 30 in order to demonstrate functional improvement in UE ROM benchmark.    06/13/2021 PROGRESSING  4 Pt will be able to demonstrate full symmetrical ROM in all planes in order to demonstrate functional improvement in UE  function for progression to next phase of rehabilitation.     06/13/2021 INITIAL    LONG TERM GOALS:    LTG Name Target Date Goal status  1 Pt  will become independent with final HEP in order to demonstrate synthesis of PT education.     07/25/2021 INITIAL  2 Pt will score >/= 53 on FOTO to demonstrate functional improvement in R shoulder function for return to PLOF.     07/25/2021 INITIAL  3 Pt will be able to reach [facility] and carry/hold >5 lbs in order to demonstrate functional improvement in R UE strength for return to PLOF and exercise.    07/25/2021 INITIAL  4 Pt will be able to demonstrate full AROM in all planes in order to demonstrate functional improvement in UE function for self-care and house hold duties.    07/25/2021 INITIAL    PLAN: FREQUENCY:  2 Times per week  DURATION:  7-8 WEEKS   PLANNED INTERVENTIONS: Therapeutic exercises, Therapeutic activity, Neuro Muscular re-education, Patient/Family education, Joint mobilization, Aquatic Therapy, Dry Needling, Electrical stimulation, Spinal mobilization, Cryotherapy, Moist heat, scar mobilization, Splintting, Taping, Vasopneumatic device, Traction, Ultrasound, Ionotophoresis 70m/ml Dexamethasone, and Manual therapy   PLAN FOR NEXT SESSION: Cont with ROM and exercises per MD Reverse TSA protocol.     RSelinda MichaelsIII PT, DPT 06/12/21 8:58 AM

## 2021-06-18 ENCOUNTER — Encounter (HOSPITAL_BASED_OUTPATIENT_CLINIC_OR_DEPARTMENT_OTHER): Payer: Self-pay | Admitting: Physical Therapy

## 2021-06-18 ENCOUNTER — Ambulatory Visit (HOSPITAL_BASED_OUTPATIENT_CLINIC_OR_DEPARTMENT_OTHER): Payer: Medicare Other | Admitting: Physical Therapy

## 2021-06-18 ENCOUNTER — Other Ambulatory Visit: Payer: Self-pay

## 2021-06-18 DIAGNOSIS — M6281 Muscle weakness (generalized): Secondary | ICD-10-CM

## 2021-06-18 DIAGNOSIS — M25611 Stiffness of right shoulder, not elsewhere classified: Secondary | ICD-10-CM

## 2021-06-18 DIAGNOSIS — M25511 Pain in right shoulder: Secondary | ICD-10-CM

## 2021-06-18 NOTE — Therapy (Signed)
OUTPATIENT PHYSICAL THERAPY TREATMENT NOTE      Patient Name: Dwayne Wade MRN: [id number] DOB:October 22, 1950, 70 y.o., male 70 Date: 06/18/2021  PCP: Alan Ripper, PA REFERRING MD:  Vanetta Mulders, MD  PT End of Session - 06/18/21 0810     Visit Number 13    Number of Visits 21    Date for PT Re-Evaluation 07/31/21    Authorization Type Medicare    PT Start Time 0807    PT Stop Time 6440    PT Time Calculation (min) 40 min    Activity Tolerance Patient tolerated treatment well    Behavior During Therapy [facility] for tasks assessed/performed                   Past Medical History:  Diagnosis Date   Arthritis    Hypertension    Pre-diabetes    Past Surgical History:  Procedure Laterality Date   REVERSE SHOULDER ARTHROPLASTY Right 04/30/2021   Procedure: RIGHT REVERSE SHOULDER ARTHROPLASTY;  Surgeon: Vanetta Mulders, MD;  Location: [place];  Service: Orthopedics;  Laterality: Right;   TONSILLECTOMY  1990   There are no problems to display for this patient.   REFERRING DIAG:  M12.811 (ICD-10-CM) - Rotator cuff arthropathy of right shoulder  Surgery date 04/30/2021 Total reverse shoulder  THERAPY DIAG:  Decreased right shoulder range of motion  Acute pain of right shoulder  Muscle weakness (generalized)  PERTINENT HISTORY: Pre-diabetic, HTN   PRECAUTIONS: Shoulder- no R UE WB.  No UBE per protocol.  SUBJECTIVE:  Pt is 7 weeks s/p R reverse total shoulder.  Pt reports compliance with HEP.  He states he had one occasion of 4/10 pain after performing HEP.  Pt reports he is able to take a shower and can almost dress himself.  Pt states he can eat with R UE.  Pt is able to touch head with R UE.  Pt is limited with performing ADLs and IADLs and is limited per protocol.  He has difficulty with bed mobility and is limited with reaching activities.  Pt is unable to perform lifting.  Pt denies any adverse effects after prior Rx. Pt reports he slept in the  bed last night.  He had 3/10 pain in the bed but was able to sleep in the bed.  Pt's wife states he uses Tylenol at night which improves his pain.  Pt is not using sling except when in a large crowd.     PAIN:  Are you having pain? Yes NRPS:  2-3/10 current, Worst pain 5-6/10 Location: R shoulder.     OBJECTIVE:   TODAY'S TREATMENT:      OBSERVATION:  Pt not wearing sling. Incision intact, closed, and healing well.  No drainage.        Therapeutic Exercise: -Reviewed response to prior Rx, HEP compliance, current pt presentation, and pain level. -Pt performed: Pendulums cw, ccw, and f/b x 20 reps each Supine shoulder flexion AROM 2x10 reps Supine serratus punches 2x10 reps Supine ER AROM 2x10 reps Supine shoulder ABC x 1 rep S/L shoulder Abd w/n protocol range 2x10 reps -Pt received R shoulder PROM in flexion, scaption, and ER in supine and gentle abduction per protocol range and pt/tissue tolerance -See Pt education provided below.     PATIENT EDUCATION: Education details:  Post op protocol and restrictions.  HEP.  Instructed pt and wife in protocol range restrictions of ER and flexion. Education provided concerning exercise form. Person educated: Patient and Spouse Education  method: Explanation, Demonstration, Tactile cues, Verbal cues, handout Education comprehension: verbalized understanding, returned demonstration, verbal cues required, and tactile cues required     HOME EXERCISE PROGRAM: Access Code: [facility] URL: [email or web address] Date: 06/12/2021 Prepared by: Ronny Flurry  Exercises Seated Scapular Retraction - 3-5 x daily - 7 x weekly - 1 sets - 10 reps Circular Shoulder Pendulum with Table Support - 3-5 x daily - 7 x weekly - 1 sets - 20 reps Standing Elbow Flexion Extension AROM - 3-5 x daily - 7 x weekly - 1 sets - 20 reps Seated Gripping Towel - 3-5 x daily - 7 x weekly - 1 sets - 20 reps Supine Shoulder External Rotation with Dowel at  20 Degrees of Abduction - 2 x daily - 7 x weekly - 1 sets - 15 reps - 5 hold Supine Shoulder Wand flexion AAROM - 2 x daily - 7 x weekly - 1-2 sets - 10 reps Supine Shoulder Flexion AROM - 2 x daily - 7 x weekly - 2 sets - 10 reps Isometric Shoulder Flexion at Wall - 1 x daily - 5 x weekly - 1-2 sets - 10 reps - 5 seconds hold Isometric Shoulder Abduction at Wall - 1 x daily - 5 x weekly - 1-2 sets - 10 reps - 5 seconds hold Isometric Shoulder Extension at Wall - 1 x daily - 7 x weekly - 1-2 sets - 10 reps - 5 seconds hold         ASSESSMENT:   CLINICAL IMPRESSION:  Pt is progressing well with function, ROM, and protocol.  Pt reports improved functional usage of R UE with ADLs/self care activities.  Pt is not using sling except in crowds.  PT progressed AROM exercises per protocol and pt performed well with cuing and instruction in correct form.  Pt is improving with AROM in gravity minimized positions.   He demonstrates improved flexion PROM as evidenced by visual observation.  He responded well to Rx having no increased pain after Rx.  Pt should benefit from continued skilled therapy per protocol in order to address ongoing goals and to assist in restoring desired level of function.           Objective impairments include decreased ROM, decreased strength, hypomobility, increased edema, increased fascial restrictions, increased muscle spasms, impaired flexibility, impaired UE functional use, improper body mechanics, postural dysfunction, and pain. These impairments are limiting patient from cleaning, community activity, driving, meal prep, occupation, laundry, yard work, and shopping.            GOALS:     SHORT TERM GOALS:   STG Name Target Date Goal status  1 Pt will become independent with HEP in order to demonstrate synthesis of PT education.    05/16/2021 GOAL MET  2 Pt will score at least 23 pt increase on FOTO to demonstrate functional improvement in MCII and pt perceived  function.     06/13/2021 PARTIALLY MET; Pt had 22 pt increase  3 Pt will be able to demonstrate fwd flexion to 130 and ER to 30 in order to demonstrate functional improvement in UE ROM benchmark.    06/13/2021 PROGRESSING  4 Pt will be able to demonstrate full symmetrical ROM in all planes in order to demonstrate functional improvement in UE function for progression to next phase of rehabilitation.     06/13/2021 INITIAL    LONG TERM GOALS:    LTG Name Target Date Goal status  1 Pt  will become  independent with final HEP in order to demonstrate synthesis of PT education.     07/25/2021 INITIAL  2 Pt will score >/= 53 on FOTO to demonstrate functional improvement in R shoulder function for return to PLOF.     07/25/2021 INITIAL  3 Pt will be able to reach [facility] and carry/hold >5 lbs in order to demonstrate functional improvement in R UE strength for return to PLOF and exercise.    07/25/2021 INITIAL  4 Pt will be able to demonstrate full AROM in all planes in order to demonstrate functional improvement in UE function for self-care and house hold duties.    07/25/2021 INITIAL    PLAN: FREQUENCY:  2 Times per week  DURATION:  7-8 WEEKS   PLANNED INTERVENTIONS: Therapeutic exercises, Therapeutic activity, Neuro Muscular re-education, Patient/Family education, Joint mobilization, Aquatic Therapy, Dry Needling, Electrical stimulation, Spinal mobilization, Cryotherapy, Moist heat, scar mobilization, Splintting, Taping, Vasopneumatic device, Traction, Ultrasound, Ionotophoresis 1m/ml Dexamethasone, and Manual therapy   PLAN FOR NEXT SESSION: Cont with ROM and exercises per MD Reverse TSA protocol.     RSelinda MichaelsIII PT, DPT 06/18/21 10:30 PM

## 2021-06-20 ENCOUNTER — Ambulatory Visit (HOSPITAL_BASED_OUTPATIENT_CLINIC_OR_DEPARTMENT_OTHER): Payer: Medicare Other | Admitting: Physical Therapy

## 2021-06-20 ENCOUNTER — Encounter (HOSPITAL_BASED_OUTPATIENT_CLINIC_OR_DEPARTMENT_OTHER): Payer: Self-pay | Admitting: Physical Therapy

## 2021-06-20 ENCOUNTER — Other Ambulatory Visit: Payer: Self-pay

## 2021-06-20 DIAGNOSIS — M25511 Pain in right shoulder: Secondary | ICD-10-CM

## 2021-06-20 DIAGNOSIS — M25611 Stiffness of right shoulder, not elsewhere classified: Secondary | ICD-10-CM

## 2021-06-20 DIAGNOSIS — M6281 Muscle weakness (generalized): Secondary | ICD-10-CM

## 2021-06-20 NOTE — Therapy (Signed)
OUTPATIENT PHYSICAL THERAPY TREATMENT NOTE      Patient Name: Dwayne Wade MRN: [id number] DOB:1951/06/22, 70 y.o., male 27 Date: 06/20/2021  PCP: Alan Ripper, PA REFERRING MD:  Vanetta Mulders, MD  PT End of Session - 06/20/21 0834     Visit Number 14    Number of Visits 21    Date for PT Re-Evaluation 07/31/21    Authorization Type Medicare    PT Start Time 0806    PT Stop Time 0848    PT Time Calculation (min) 42 min    Activity Tolerance Patient tolerated treatment well    Behavior During Therapy [facility] for tasks assessed/performed                    Past Medical History:  Diagnosis Date   Arthritis    Hypertension    Pre-diabetes    Past Surgical History:  Procedure Laterality Date   REVERSE SHOULDER ARTHROPLASTY Right 04/30/2021   Procedure: RIGHT REVERSE SHOULDER ARTHROPLASTY;  Surgeon: Vanetta Mulders, MD;  Location: [place];  Service: Orthopedics;  Laterality: Right;   TONSILLECTOMY  1990   There are no problems to display for this patient.   REFERRING DIAG:  M12.811 (ICD-10-CM) - Rotator cuff arthropathy of right shoulder  Surgery date 04/30/2021 Total reverse shoulder  THERAPY DIAG:  Decreased right shoulder range of motion  Acute pain of right shoulder  Muscle weakness (generalized)  PERTINENT HISTORY: Pre-diabetic, HTN   PRECAUTIONS: Shoulder- no R UE WB.  No UBE per protocol.  SUBJECTIVE:  Pt is 7 weeks and 2 days s/p R reverse total shoulder.  Pt reports compliance with HEP.  He states he had one occasion of 4/10 pain after performing HEP.  Pt reports he is able to take a shower and can almost dress himself.  Pt states he can eat with R UE.  Pt is able to touch head with R UE.  Pt reports he is able to drive a little bit.  Pt states he was able to reach behind his back gently for toileting.  Pt is limited with performing ADLs and IADLs and is limited per protocol.  He has difficulty with bed mobility and is limited with  reaching activities.  Pt is unable to perform lifting.  Pt reports he still has difficulty sleeping in the bed.  Pt's wife states he uses Tylenol at night which improves his pain.  Pt is not using sling except when in a large crowd.    Pt states he had soreness and a little pain after prior Rx.  Pt states the pain lasted about 30 mins to 1 hour.  Pt states he doesn't have constant pain, but has times when the pain "hits".     PAIN:  Are you having pain? Yes NRPS:  2-3/10 current, Worst pain 5-6/10 Location: R shoulder.     OBJECTIVE:   TODAY'S TREATMENT:      OBSERVATION:  Pt not wearing sling. Incision intact, closed, and healing well.  No drainage.        Therapeutic Exercise: -Reviewed response to prior Rx, HEP compliance, current pt presentation, and pain level. -Pt performed: Supine shoulder flexion AROM with head elevated 2x10 reps Supine serratus punches 2x10 reps Supine ER AROM 2x10 reps Supine shoulder ABC x 1 rep S/L shoulder Abd w/n protocol range 2x10 reps Prone extension w/n protocol range 2x10 reps  -Pt received R shoulder PROM in flexion, scaption, and ER in supine and gentle abduction per  protocol range and pt/tissue tolerance -Updated HEP and gave pt a handout. -See Pt education provided below.     PATIENT EDUCATION: Education details:  Post op protocol and restrictions.  Instructed pt in proper reaching behind back per protocol.  Updated HEP and gave pt a HEP handout.  Educated pt in correct form and appropriate frequency.  Education provided concerning exercise form. Person educated: Patient and Spouse Education method: Explanation, Demonstration, Tactile cues, Verbal cues, handout Education comprehension: verbalized understanding, returned demonstration, verbal cues required, and tactile cues required     HOME EXERCISE PROGRAM: Access Code: [facility] URL: [email or web address] Date: 06/20/2021 Prepared by: Ronny Flurry  Exercises Seated Scapular Retraction - 3-5 x daily - 7 x weekly - 1 sets - 10 reps Circular Shoulder Pendulum with Table Support - 3-5 x daily - 7 x weekly - 1 sets - 20 reps Standing Elbow Flexion Extension AROM - 3-5 x daily - 7 x weekly - 1 sets - 20 reps Seated Gripping Towel - 3-5 x daily - 7 x weekly - 1 sets - 20 reps Supine Shoulder External Rotation with Dowel at 20 Degrees of Abduction - 2 x daily - 7 x weekly - 1 sets - 15 reps - 5 hold Supine Shoulder Wand flexion AAROM - 2 x daily - 7 x weekly - 1-2 sets - 10 reps Supine Shoulder Flexion AROM - 2 x daily - 7 x weekly - 2 sets - 10 reps Isometric Shoulder Flexion at Wall - 1 x daily - 5 x weekly - 1-2 sets - 10 reps - 5 seconds hold Isometric Shoulder Abduction at Wall - 1 x daily - 5 x weekly - 1-2 sets - 10 reps - 5 seconds hold Isometric Shoulder Extension at Wall - 1 x daily - 7 x weekly - 1-2 sets - 10 reps - 5 seconds hold Single Arm Serratus Punches - 1 x daily - 6-7 x weekly - 2 sets - 10 reps Supine Shoulder Alphabet - 1-2 x daily - 7 x weekly - 1 reps          ASSESSMENT:   CLINICAL IMPRESSION:  Pt continues to progress well with function, ROM, and protocol.  Pt reports improved functional usage of R UE with ADLs/self care activities. PT progressed AROM exercises per protocol and pt performed well with cuing and instruction in correct form.  Pt performed supine shoulder flexion AROM with head elevated well.  Pt able to actively perform abd to 90 deg in S/L'ing per protocol.  He responded well to Rx having no c/o's after Rx.  Pt should benefit from continued skilled therapy per protocol in order to address ongoing goals and to assist in restoring desired level of function.           Objective impairments include decreased ROM, decreased strength, hypomobility, increased edema, increased fascial restrictions, increased muscle spasms, impaired flexibility, impaired UE functional use, improper body mechanics,  postural dysfunction, and pain. These impairments are limiting patient from cleaning, community activity, driving, meal prep, occupation, laundry, yard work, and shopping.            GOALS:     SHORT TERM GOALS:   STG Name Target Date Goal status  1 Pt will become independent with HEP in order to demonstrate synthesis of PT education.    05/16/2021 GOAL MET  2 Pt will score at least 23 pt increase on FOTO to demonstrate functional improvement in MCII and pt perceived function.  06/13/2021 PARTIALLY MET; Pt had 22 pt increase  3 Pt will be able to demonstrate fwd flexion to 130 and ER to 30 in order to demonstrate functional improvement in UE ROM benchmark.    06/13/2021 PROGRESSING  4 Pt will be able to demonstrate full symmetrical ROM in all planes in order to demonstrate functional improvement in UE function for progression to next phase of rehabilitation.     06/13/2021 INITIAL    LONG TERM GOALS:    LTG Name Target Date Goal status  1 Pt  will become independent with final HEP in order to demonstrate synthesis of PT education.     07/25/2021 INITIAL  2 Pt will score >/= 53 on FOTO to demonstrate functional improvement in R shoulder function for return to PLOF.     07/25/2021 INITIAL  3 Pt will be able to reach [facility] and carry/hold >5 lbs in order to demonstrate functional improvement in R UE strength for return to PLOF and exercise.    07/25/2021 INITIAL  4 Pt will be able to demonstrate full AROM in all planes in order to demonstrate functional improvement in UE function for self-care and house hold duties.    07/25/2021 INITIAL    PLAN:    PLANNED INTERVENTIONS: Therapeutic exercises, Therapeutic activity, Neuro Muscular re-education, Patient/Family education, Joint mobilization, Aquatic Therapy, Dry Needling, Electrical stimulation, Spinal mobilization, Cryotherapy, Moist heat, scar mobilization, Splintting, Taping, Vasopneumatic device, Traction, Ultrasound, Ionotophoresis  45m/ml Dexamethasone, and Manual therapy   PLAN FOR NEXT SESSION: Cont with ROM and exercises per MD Reverse TSA protocol.     RSelinda MichaelsIII PT, DPT 06/20/21 8:57 AM

## 2021-06-24 ENCOUNTER — Ambulatory Visit (HOSPITAL_BASED_OUTPATIENT_CLINIC_OR_DEPARTMENT_OTHER): Payer: Medicare Other | Admitting: Physical Therapy

## 2021-06-24 ENCOUNTER — Other Ambulatory Visit: Payer: Self-pay

## 2021-06-24 ENCOUNTER — Encounter (HOSPITAL_BASED_OUTPATIENT_CLINIC_OR_DEPARTMENT_OTHER): Payer: Self-pay | Admitting: Physical Therapy

## 2021-06-24 DIAGNOSIS — M25611 Stiffness of right shoulder, not elsewhere classified: Secondary | ICD-10-CM | POA: Diagnosis not present

## 2021-06-24 DIAGNOSIS — M6281 Muscle weakness (generalized): Secondary | ICD-10-CM

## 2021-06-24 DIAGNOSIS — M25511 Pain in right shoulder: Secondary | ICD-10-CM

## 2021-06-24 NOTE — Therapy (Signed)
OUTPATIENT PHYSICAL THERAPY TREATMENT NOTE      Patient Name: Dwayne Wade MRN: [id number] DOB:11-21-1950, 70 y.o., male 58 Date: 06/24/2021  PCP: Alan Ripper, PA REFERRING MD:  Vanetta Mulders, MD  PT End of Session - 06/24/21 0844     Visit Number 15    Number of Visits 21    Date for PT Re-Evaluation 07/31/21    Authorization Type Medicare    PT Start Time 0807    PT Stop Time 4580    PT Time Calculation (min) 40 min    Activity Tolerance Patient tolerated treatment well    Behavior During Therapy [facility] for tasks assessed/performed                     Past Medical History:  Diagnosis Date   Arthritis    Hypertension    Pre-diabetes    Past Surgical History:  Procedure Laterality Date   REVERSE SHOULDER ARTHROPLASTY Right 04/30/2021   Procedure: RIGHT REVERSE SHOULDER ARTHROPLASTY;  Surgeon: Vanetta Mulders, MD;  Location: [place];  Service: Orthopedics;  Laterality: Right;   TONSILLECTOMY  1990   There are no problems to display for this patient.   REFERRING DIAG:  M12.811 (ICD-10-CM) - Rotator cuff arthropathy of right shoulder  Surgery date 04/30/2021 Total reverse shoulder  THERAPY DIAG:  Decreased right shoulder range of motion  Acute pain of right shoulder  Muscle weakness (generalized)  PERTINENT HISTORY: Pre-diabetic, HTN   PRECAUTIONS: Shoulder- no R UE WB.  No UBE per protocol.  SUBJECTIVE:  Pt is 7 weeks and 6 days s/p R reverse total shoulder.  Pt reports compliance with HEP.  Pt states he is able to eat with R UE pretty good without difficulty.  Pt is able to touch the back of his head with R UE.  Pt reports he is able to drive a little bit.  Pt states he was able to reach behind his back gently for toileting.  Pt is limited with performing ADLs and IADLs and is limited per protocol.  He has difficulty with bed mobility and is limited with reaching activities.  Pt is unable to perform lifting.  Pt is not using sling  except when in a large crowd.  Pt went to church on Sunday and used his sling.  Pt reports he had 2-3/10 pain lying in bed last night.  He got up and moved his arm some and felt better.  Pt reports he had 3-4/10 pain that lasted 10 mins after Rx.  He reports having minimal pain (1-2/10) later after prior Rx.  Pt continues to have difficulty sleeping in bed.     PAIN:  Are you having pain? Yes NRPS:  2/10 current, Worst pain 5-6/10 Location: R shoulder.     OBJECTIVE:   TODAY'S TREATMENT:      OBSERVATION:  Pt not wearing sling. Incision intact, closed, and healing well.  No drainage.        Therapeutic Exercise: -Reviewed response to prior Rx, HEP compliance, current pt presentation, and pain level. -Pt performed: Supine shoulder flexion AROM with head elevated 2x10 reps Supine serratus punches 2x10 reps Supine ER AROM 2x10 reps Supine shoulder ABC x 1 rep S/L shoulder Abd w/n protocol range 2x10 reps Prone extension w/n protocol range 2x10 reps Standing wall walks in flexion x 5 reps  -Pt received R shoulder PROM in flexion, scaption, and ER in supine and gentle abduction per protocol range and pt/tissue tolerance -See Pt  education provided below.     PATIENT EDUCATION: Education details:  Post op protocol and restrictions. Education provided concerning exercise form.  Instructed pt to use ice later if needed for soreness or pain. Person educated: Patient and Spouse Education method: Explanation, Demonstration, Tactile cues, Verbal cues, Education comprehension: verbalized understanding, returned demonstration, verbal cues required, and tactile cues required     HOME EXERCISE PROGRAM: Access Code: [facility] URL: [email or web address] Date: 06/20/2021 Prepared by: Ronny Flurry  Exercises Seated Scapular Retraction - 3-5 x daily - 7 x weekly - 1 sets - 10 reps Circular Shoulder Pendulum with Table Support - 3-5 x daily - 7 x weekly - 1 sets - 20  reps Standing Elbow Flexion Extension AROM - 3-5 x daily - 7 x weekly - 1 sets - 20 reps Seated Gripping Towel - 3-5 x daily - 7 x weekly - 1 sets - 20 reps Supine Shoulder External Rotation with Dowel at 20 Degrees of Abduction - 2 x daily - 7 x weekly - 1 sets - 15 reps - 5 hold Supine Shoulder Wand flexion AAROM - 2 x daily - 7 x weekly - 1-2 sets - 10 reps Supine Shoulder Flexion AROM - 2 x daily - 7 x weekly - 2 sets - 10 reps Isometric Shoulder Flexion at Wall - 1 x daily - 5 x weekly - 1-2 sets - 10 reps - 5 seconds hold Isometric Shoulder Abduction at Wall - 1 x daily - 5 x weekly - 1-2 sets - 10 reps - 5 seconds hold Isometric Shoulder Extension at Wall - 1 x daily - 7 x weekly - 1-2 sets - 10 reps - 5 seconds hold Single Arm Serratus Punches - 1 x daily - 6-7 x weekly - 2 sets - 10 reps Supine Shoulder Alphabet - 1-2 x daily - 7 x weekly - 1 reps          ASSESSMENT:   CLINICAL IMPRESSION:  Pt continues to progress well with function, ROM, and protocol.  Pt reports improved functional usage of R UE with ADLs/self care activities. Pt is progressing with ther ex and AROM exercises per protocol.  He performed exercises well with cuing and instruction in correct form.  Pt performed supine shoulder flexion AROM with head elevated well.  Pt has difficulty and limitations with AAROM flexion wall walk and demonstrated a compensatory shoulder hike.  He responded well to Rx and reports minimal increase in pain after Rx.  Pt should continue to benefit from continued skilled therapy per protocol in order to address ongoing goals and to assist in restoring desired level of function.           Objective impairments include decreased ROM, decreased strength, hypomobility, increased edema, increased fascial restrictions, increased muscle spasms, impaired flexibility, impaired UE functional use, improper body mechanics, postural dysfunction, and pain. These impairments are limiting patient from  cleaning, community activity, driving, meal prep, occupation, laundry, yard work, and shopping.            GOALS:     SHORT TERM GOALS:   STG Name Target Date Goal status  1 Pt will become independent with HEP in order to demonstrate synthesis of PT education.    05/16/2021 GOAL MET  2 Pt will score at least 23 pt increase on FOTO to demonstrate functional improvement in MCII and pt perceived function.     06/13/2021 PARTIALLY MET; Pt had 22 pt increase  3 Pt will be able to demonstrate  fwd flexion to 130 and ER to 30 in order to demonstrate functional improvement in UE ROM benchmark.    06/13/2021 PROGRESSING  4 Pt will be able to demonstrate full symmetrical ROM in all planes in order to demonstrate functional improvement in UE function for progression to next phase of rehabilitation.     06/13/2021 INITIAL    LONG TERM GOALS:    LTG Name Target Date Goal status  1 Pt  will become independent with final HEP in order to demonstrate synthesis of PT education.     07/25/2021 INITIAL  2 Pt will score >/= 53 on FOTO to demonstrate functional improvement in R shoulder function for return to PLOF.     07/25/2021 INITIAL  3 Pt will be able to reach [facility] and carry/hold >5 lbs in order to demonstrate functional improvement in R UE strength for return to PLOF and exercise.    07/25/2021 INITIAL  4 Pt will be able to demonstrate full AROM in all planes in order to demonstrate functional improvement in UE function for self-care and house hold duties.    07/25/2021 INITIAL    PLAN:    PLANNED INTERVENTIONS: Therapeutic exercises, Therapeutic activity, Neuro Muscular re-education, Patient/Family education, Joint mobilization, Aquatic Therapy, Dry Needling, Electrical stimulation, Spinal mobilization, Cryotherapy, Moist heat, scar mobilization, Splintting, Taping, Vasopneumatic device, Traction, Ultrasound, Ionotophoresis 70m/ml Dexamethasone, and Manual therapy   PLAN FOR NEXT SESSION: Cont with ROM  and exercises per MD Reverse TSA protocol.     RSelinda MichaelsIII PT, DPT 06/24/21 10:57 PM

## 2021-07-01 ENCOUNTER — Other Ambulatory Visit: Payer: Self-pay

## 2021-07-01 ENCOUNTER — Encounter (HOSPITAL_BASED_OUTPATIENT_CLINIC_OR_DEPARTMENT_OTHER): Payer: Self-pay | Admitting: Physical Therapy

## 2021-07-01 ENCOUNTER — Ambulatory Visit (HOSPITAL_BASED_OUTPATIENT_CLINIC_OR_DEPARTMENT_OTHER): Payer: Medicare Other | Attending: Orthopaedic Surgery | Admitting: Physical Therapy

## 2021-07-01 DIAGNOSIS — M25511 Pain in right shoulder: Secondary | ICD-10-CM

## 2021-07-01 DIAGNOSIS — M6281 Muscle weakness (generalized): Secondary | ICD-10-CM

## 2021-07-01 DIAGNOSIS — M25611 Stiffness of right shoulder, not elsewhere classified: Secondary | ICD-10-CM | POA: Diagnosis not present

## 2021-07-01 NOTE — Therapy (Signed)
OUTPATIENT PHYSICAL THERAPY TREATMENT NOTE      Patient Name: Dwayne Wade MRN: [id number] DOB:January 11, 1951, 69 y.o., male 80 Date: 07/01/2021  PCP: Alan Ripper, PA REFERRING MD:  Vanetta Mulders, MD  PT End of Session - 07/01/21 0858     Visit Number 16    Number of Visits 21    Date for PT Re-Evaluation 07/31/21    Authorization Type Medicare    PT Start Time 0807    PT Stop Time 0848    PT Time Calculation (min) 41 min    Activity Tolerance Patient tolerated treatment well    Behavior During Therapy [facility] for tasks assessed/performed                      Past Medical History:  Diagnosis Date   Arthritis    Hypertension    Pre-diabetes    Past Surgical History:  Procedure Laterality Date   REVERSE SHOULDER ARTHROPLASTY Right 04/30/2021   Procedure: RIGHT REVERSE SHOULDER ARTHROPLASTY;  Surgeon: Vanetta Mulders, MD;  Location: [place];  Service: Orthopedics;  Laterality: Right;   TONSILLECTOMY  1990   There are no problems to display for this patient.    REFERRING DIAG:  M12.811 (ICD-10-CM) - Rotator cuff arthropathy of right shoulder  Surgery date 04/30/2021 Total reverse shoulder  THERAPY DIAG:  Decreased right shoulder range of motion  Acute pain of right shoulder  Muscle weakness (generalized)  PERTINENT HISTORY: Pre-diabetic, HTN   PRECAUTIONS: Shoulder- no R UE WB.  No UBE per protocol.  SUBJECTIVE:  Pt is 8 weeks and 6 days s/p R reverse total shoulder. -Pt reports he had increased pain to 3/10 after prior Rx which lasted approx 2-3 hours.  Pt reports compliance with HEP and reports 2-3/10 pain after exercises.  Pt reports he is able to reach forward a little more.  He is able to reach top and back of his head better.  Pt feels pretty good driving though can feel it when he turns the steering wheel.  He continues to have difficulty sleeping in bed though is a little better.   Pt states he is able to reach to grab a water  bottle.  Pt's wife reports he was able to open cans for her.  - Pt is not using sling except when in a large crowd. - Pt is limited with reaching activities and unable to perform overhead or lifting activities.    PAIN:  Are you having pain? Yes NRPS:  2/10 current, Worst pain 5-6/10 Location: R shoulder.     OBJECTIVE:   TODAY'S TREATMENT:      OBSERVATION:  Pt not wearing sling. Incision intact, closed, and healing well.  No drainage.        Therapeutic Exercise: -Reviewed response to prior Rx, HEP compliance, current pt presentation, and pain level. -Pt performed: Supine shoulder flexion AROM with head elevated 2x10 reps Supine serratus punches 2x10 reps Supine ER AROM 2x10 reps Supine shoulder ABC x 1 rep S/L shoulder Abd with head elevated w/n protocol range 2x10 reps Prone extension w/n protocol range 2x10 reps Standing wall walks in flexion x 5 reps Standing wand shoulder flexion AAROM 3x5 reps  -Pt received R shoulder PROM in flexion, scaption, and ER in supine and gentle abduction per protocol range and pt/tissue tolerance -See Pt education provided below.     PATIENT EDUCATION: Education details:  Post op protocol and restrictions. Education provided concerning exercise form.  PT updated HEP  and gave pt a HEP handout consisting of prone extension to neutral.  Educated pt in lifting arm past side and pt and wife understand.  Instructed pt in correct form and appropriate frequency.  Person educated: Patient and Spouse Education method: Explanation, Demonstration, Tactile cues, Verbal cues, Education comprehension: verbalized understanding, returned demonstration, verbal cues required, and tactile cues required     HOME EXERCISE PROGRAM: Access Code: [facility] URL: [email or web address] Date: 06/20/2021 Prepared by: Ronny Flurry  Exercises Seated Scapular Retraction - 3-5 x daily - 7 x weekly - 1 sets - 10 reps Circular Shoulder Pendulum with  Table Support - 3-5 x daily - 7 x weekly - 1 sets - 20 reps Standing Elbow Flexion Extension AROM - 3-5 x daily - 7 x weekly - 1 sets - 20 reps Seated Gripping Towel - 3-5 x daily - 7 x weekly - 1 sets - 20 reps Supine Shoulder External Rotation with Dowel at 20 Degrees of Abduction - 2 x daily - 7 x weekly - 1 sets - 15 reps - 5 hold Supine Shoulder Wand flexion AAROM - 2 x daily - 7 x weekly - 1-2 sets - 10 reps Supine Shoulder Flexion AROM - 2 x daily - 7 x weekly - 2 sets - 10 reps Isometric Shoulder Flexion at Wall - 1 x daily - 5 x weekly - 1-2 sets - 10 reps - 5 seconds hold Isometric Shoulder Abduction at Wall - 1 x daily - 5 x weekly - 1-2 sets - 10 reps - 5 seconds hold Isometric Shoulder Extension at Wall - 1 x daily - 7 x weekly - 1-2 sets - 10 reps - 5 seconds hold Single Arm Serratus Punches - 1 x daily - 6-7 x weekly - 2 sets - 10 reps Supine Shoulder Alphabet - 1-2 x daily - 7 x weekly - 1 reps Prone shoulder extension to neutral 5-6x daily - once per day - 2 sets of 10 reps          ASSESSMENT:   CLINICAL IMPRESSION:  Pt continues to report improved functional usage of R UE.  Pt is progressing appropriately with protocol as evidenced by improved ROM and performance with ther ex including gravity minimized AROM and standing AAROM.  Pt demonstrates much improvement with performance of standing flexion AAROM today.  He had decreased trunk lean and shoulder hike with standing shoulder flexion AAROM.  Pt tolerated PROM well.  He responded well to Rx having no c/o's including no increased pain (1/10) after Rx.  Pt should continue to benefit from continued skilled therapy per protocol in order to address ongoing goals and to assist in restoring desired level of function.           Objective impairments include decreased ROM, decreased strength, hypomobility, increased edema, increased fascial restrictions, increased muscle spasms, impaired flexibility, impaired UE functional use,  improper body mechanics, postural dysfunction, and pain. These impairments are limiting patient from cleaning, community activity, driving, meal prep, occupation, laundry, yard work, and shopping.            GOALS:     SHORT TERM GOALS:   STG Name Target Date Goal status  1 Pt will become independent with HEP in order to demonstrate synthesis of PT education.    05/16/2021 GOAL MET  2 Pt will score at least 23 pt increase on FOTO to demonstrate functional improvement in MCII and pt perceived function.     06/13/2021 PARTIALLY MET; Pt had 22  pt increase  3 Pt will be able to demonstrate fwd flexion to 130 and ER to 30 in order to demonstrate functional improvement in UE ROM benchmark.    06/13/2021 PROGRESSING  4 Pt will be able to demonstrate full symmetrical ROM in all planes in order to demonstrate functional improvement in UE function for progression to next phase of rehabilitation.     06/13/2021 INITIAL    LONG TERM GOALS:    LTG Name Target Date Goal status  1 Pt  will become independent with final HEP in order to demonstrate synthesis of PT education.     07/25/2021 INITIAL  2 Pt will score >/= 53 on FOTO to demonstrate functional improvement in R shoulder function for return to PLOF.     07/25/2021 INITIAL  3 Pt will be able to reach [facility] and carry/hold >5 lbs in order to demonstrate functional improvement in R UE strength for return to PLOF and exercise.    07/25/2021 INITIAL  4 Pt will be able to demonstrate full AROM in all planes in order to demonstrate functional improvement in UE function for self-care and house hold duties.    07/25/2021 INITIAL    PLAN:    PLANNED INTERVENTIONS: Therapeutic exercises, Therapeutic activity, Neuro Muscular re-education, Patient/Family education, Joint mobilization, Aquatic Therapy, Dry Needling, Electrical stimulation, Spinal mobilization, Cryotherapy, Moist heat, scar mobilization, Splintting, Taping, Vasopneumatic device, Traction,  Ultrasound, Ionotophoresis 109m/ml Dexamethasone, and Manual therapy   PLAN FOR NEXT SESSION: Cont with ROM and exercises per MD Reverse TSA protocol.  Add standing AAROM to HEP next visit if pt tolerating well.   RSelinda MichaelsIII PT, DPT 07/01/21 11:54 AM

## 2021-07-03 ENCOUNTER — Encounter (HOSPITAL_BASED_OUTPATIENT_CLINIC_OR_DEPARTMENT_OTHER): Payer: Self-pay | Admitting: Physical Therapy

## 2021-07-03 ENCOUNTER — Ambulatory Visit (HOSPITAL_BASED_OUTPATIENT_CLINIC_OR_DEPARTMENT_OTHER): Payer: Medicare Other | Admitting: Physical Therapy

## 2021-07-03 ENCOUNTER — Other Ambulatory Visit: Payer: Self-pay

## 2021-07-03 DIAGNOSIS — M25511 Pain in right shoulder: Secondary | ICD-10-CM

## 2021-07-03 DIAGNOSIS — M25611 Stiffness of right shoulder, not elsewhere classified: Secondary | ICD-10-CM

## 2021-07-03 DIAGNOSIS — M6281 Muscle weakness (generalized): Secondary | ICD-10-CM

## 2021-07-03 NOTE — Therapy (Signed)
°OUTPATIENT PHYSICAL THERAPY TREATMENT NOTE ° ° ° ° ° °Patient Name: Dwayne Wade °MRN: [id number] °DOB:01/13/1951, 70 y.o., male °Today's Date: 07/03/2021 ° °PCP: Coolidge, Nicholas, PA °REFERRING MD:  Bokshan, Steven, MD ° PT End of Session - 07/03/21 0832   ° ° Visit Number 17   ° Number of Visits 21   ° Date for PT Re-Evaluation 07/31/21   ° Authorization Type Medicare   ° PT Start Time 0808   ° PT Stop Time 0848   ° PT Time Calculation (min) 40 min   ° Activity Tolerance Patient tolerated treatment well   ° Behavior During Therapy [facility] for tasks assessed/performed   ° °  °  ° °  ° ° ° ° ° ° ° ° ° ° ° ° °Past Medical History:  °Diagnosis Date  ° Arthritis   ° Hypertension   ° Pre-diabetes   ° °Past Surgical History:  °Procedure Laterality Date  ° REVERSE SHOULDER ARTHROPLASTY Right 04/30/2021  ° Procedure: RIGHT REVERSE SHOULDER ARTHROPLASTY;  Surgeon: Bokshan, Steven, MD;  Location: [facility];  Service: Orthopedics;  Laterality: Right;  ° TONSILLECTOMY  1990  ° °There are no problems to display for this patient. ° ° ° °REFERRING DIAG:  M12.811 (ICD-10-CM) - Rotator cuff arthropathy of right shoulder ° °Surgery date 04/30/2021 Total reverse shoulder ° °THERAPY DIAG:  °Decreased right shoulder range of motion ° °Acute pain of right shoulder ° °Muscle weakness (generalized) ° °PERTINENT HISTORY: Pre-diabetic, HTN  ° °PRECAUTIONS: Shoulder- no R UE WB.  No UBE per protocol. ° °SUBJECTIVE:  Pt is 9 weeks and 1 day s/p R reverse total shoulder. °-Pt denies any adverse effects after prior Rx. Pt reports compliance with HEP and reports 1.5/10 pain after exercises.  Pt reports he is able to reach forward a little more.  He is able to reach top and back of his head better.  Pt feels pretty good driving though can feel it when he turns the steering wheel.  He continues to have difficulty sleeping in bed though is a little better.   Pt states he is able to reach to grab a water bottle.  Pt's wife reports he was able to  open cans for her.  °- Pt is not using sling except when in a large crowd. °- Pt is limited with reaching activities and unable to perform overhead or lifting activities.  ° ° °PAIN:  °Are you having pain? Yes °NRPS:  1-2/10 current, Worst pain 5-6/10 °Location: R shoulder.   ° ° °OBJECTIVE:  ° °TODAY'S TREATMENT: ° °   °  Therapeutic Exercise: °-Reviewed response to prior Rx, HEP compliance, current pt presentation, and pain level. °-Pt performed: °Supine shoulder flexion AROM with head elevated 2x10 reps °Supine serratus punches 2x10 reps °Supine ER AROM 2x10 reps °Supine shoulder ABC x 1 rep °S/L shoulder Abd with head elevated w/n protocol range 2x10 reps °Prone extension w/n protocol range 2x10 reps °Standing wall walks in flexion x 5 reps °Standing wand shoulder flexion AAROM 3x5 reps ° °-Pt received R shoulder PROM in flexion, scaption, abduction, and ER in supine per protocol range and pt/tissue tolerance °-See Pt education provided below. °  °  °PATIENT EDUCATION: °Education details:  Post op protocol and restrictions. Education provided concerning exercise form.  PT updated HEP and gave pt a HEP handout consisting of prone extension to neutral.  Educated pt in lifting arm past side and pt and wife understand.  Instructed pt in correct form   form and appropriate frequency.  Person educated: Patient and Spouse Education method: Explanation, Demonstration, Tactile cues, Verbal cues, Education comprehension: verbalized understanding, returned demonstration, verbal cues required, and tactile cues required     HOME EXERCISE PROGRAM: Access Code: [facility] URL: [email or web address] Date: 07/03/2021 Prepared by: Ronny Flurry  Exercises Seated Scapular Retraction - 3-5 x daily - 7 x weekly - 1 sets - 10 reps Circular Shoulder Pendulum with Table Support - 3-5 x daily - 7 x weekly - 1 sets - 20 reps Standing Elbow Flexion Extension AROM - 3-5 x daily - 7 x weekly - 1 sets - 20 reps Seated  Gripping Towel - 3-5 x daily - 7 x weekly - 1 sets - 20 reps Supine Shoulder External Rotation with Dowel at 20 Degrees of Abduction - 2 x daily - 7 x weekly - 1 sets - 15 reps - 5 hold Supine Shoulder Wand flexion AAROM - 2 x daily - 7 x weekly - 1-2 sets - 10 reps Supine Shoulder Flexion AROM - 2 x daily - 7 x weekly - 2 sets - 10 reps Isometric Shoulder Flexion at Wall - 1 x daily - 5 x weekly - 1-2 sets - 10 reps - 5 seconds hold Isometric Shoulder Abduction at Wall - 1 x daily - 5 x weekly - 1-2 sets - 10 reps - 5 seconds hold Isometric Shoulder Extension at Wall - 1 x daily - 7 x weekly - 1-2 sets - 10 reps - 5 seconds hold Single Arm Serratus Punches - 1 x daily - 6-7 x weekly - 2 sets - 10 reps Supine Shoulder Alphabet - 1-2 x daily - 7 x weekly - 1 reps Prone Shoulder Extension - Single Arm - 1 x daily - 5-6 x weekly - 2 sets - 10 reps Standing Shoulder Flexion Wall Walk - 1-2 x daily - 7 x weekly - 1-2 sets - 5 reps           ASSESSMENT:   CLINICAL IMPRESSION:  Pt continues to report improved functional usage of R UE.  Pt is progressing appropriately with protocol as evidenced by improved ROM and performance with ther ex including gravity minimized AROM and standing AAROM.  Pt performed exercises per protocol well and gives great effort with all exercises.  Pt is improving with standing flexion AAROM and requires cuing to reduce trunk lean and shoulder hike.  He demonstrates improved form with cuing.  Pt's wife educated in correct form also.  Pt tolerated PROM well.  He responded well to Rx having no increased pain after Rx, still 1-2/10.  Pt should continue to benefit from continued skilled therapy per protocol in order to address ongoing goals and to assist in restoring desired level of function.           Objective impairments include decreased ROM, decreased strength, hypomobility, increased edema, increased fascial restrictions, increased muscle spasms, impaired flexibility,  impaired UE functional use, improper body mechanics, postural dysfunction, and pain. These impairments are limiting patient from cleaning, community activity, driving, meal prep, occupation, laundry, yard work, and shopping.            GOALS:     SHORT TERM GOALS:   STG Name Target Date Goal status  1 Pt will become independent with HEP in order to demonstrate synthesis of PT education.    05/16/2021 GOAL MET  2 Pt will score at least 23 pt increase on FOTO to demonstrate functional improvement in MCII and pt  function.   °  06/13/2021 PARTIALLY MET; Pt had 22 pt increase  °3 Pt will be able to demonstrate fwd flexion to 130 and ER to 30 in order to demonstrate functional improvement in UE ROM benchmark.  °  06/13/2021 PROGRESSING  °4 Pt will be able to demonstrate full symmetrical ROM in all planes in order to demonstrate functional improvement in UE function for progression to next phase of rehabilitation. °  °  06/13/2021 INITIAL  °  °LONG TERM GOALS:  °  °LTG Name Target Date Goal status  °1 Pt  will become independent with final HEP in order to demonstrate synthesis of PT education. °  °  07/25/2021 INITIAL  °2 Pt will score >/= 53 on FOTO to demonstrate functional improvement in R shoulder function for return to PLOF. °  °  07/25/2021 INITIAL  °3 Pt will be able to reach [facility] and carry/hold >5 lbs in order to demonstrate functional improvement in R UE strength for return to PLOF and exercise.  °  07/25/2021 INITIAL  °4 Pt will be able to demonstrate full AROM in all planes in order to demonstrate functional improvement in UE function for self-care and house hold duties.  °  07/25/2021 INITIAL  °  °PLAN: ° °  °PLANNED INTERVENTIONS: Therapeutic exercises, Therapeutic activity, Neuro Muscular re-education, Patient/Family education, Joint mobilization, Aquatic Therapy, Dry Needling, Electrical stimulation, Spinal mobilization, Cryotherapy, Moist heat, scar mobilization, Splintting, Taping,  Vasopneumatic device, Traction, Ultrasound, Ionotophoresis 4mg/ml Dexamethasone, and Manual therapy °  °PLAN FOR NEXT SESSION: Cont with ROM and exercises per MD Reverse TSA protocol.  Add standing AAROM to HEP next visit if pt tolerating well. ° ° °Roby Harrison III PT, DPT °07/03/21 11:04 AM ° ° ° ° ° ° ° ° ° ° ° ° ° ° ° ° °   °

## 2021-07-09 ENCOUNTER — Encounter (HOSPITAL_BASED_OUTPATIENT_CLINIC_OR_DEPARTMENT_OTHER): Payer: Self-pay | Admitting: Physical Therapy

## 2021-07-09 ENCOUNTER — Other Ambulatory Visit: Payer: Self-pay

## 2021-07-09 ENCOUNTER — Ambulatory Visit (HOSPITAL_BASED_OUTPATIENT_CLINIC_OR_DEPARTMENT_OTHER): Payer: Medicare Other | Admitting: Physical Therapy

## 2021-07-09 DIAGNOSIS — M25611 Stiffness of right shoulder, not elsewhere classified: Secondary | ICD-10-CM | POA: Diagnosis not present

## 2021-07-09 DIAGNOSIS — M25511 Pain in right shoulder: Secondary | ICD-10-CM

## 2021-07-09 DIAGNOSIS — M6281 Muscle weakness (generalized): Secondary | ICD-10-CM

## 2021-07-09 NOTE — Therapy (Signed)
OUTPATIENT PHYSICAL THERAPY TREATMENT NOTE      Patient Name: Dwayne Wade MRN: [id number] DOB:[birth year], 70 y.o., male 57 Date: 07/09/2021  PCP: Alan Ripper, PA REFERRING MD:  Vanetta Mulders, MD  PT End of Session - 07/09/21 1696     Visit Number 18    Number of Visits 21    Date for PT Re-Evaluation 07/31/21    Authorization Type Medicare    PT Start Time 0804    PT Stop Time 7893    PT Time Calculation (min) 40 min    Activity Tolerance Patient tolerated treatment well    Behavior During Therapy [facility] for tasks assessed/performed                       Past Medical History:  Diagnosis Date   Arthritis    Hypertension    Pre-diabetes    Past Surgical History:  Procedure Laterality Date   REVERSE SHOULDER ARTHROPLASTY Right 04/30/2021   Procedure: RIGHT REVERSE SHOULDER ARTHROPLASTY;  Surgeon: Vanetta Mulders, MD;  Location: [place];  Service: Orthopedics;  Laterality: Right;   TONSILLECTOMY  1990   There are no problems to display for this patient.    REFERRING DIAG:  M12.811 (ICD-10-CM) - Rotator cuff arthropathy of right shoulder  Surgery date 04/30/2021 Total reverse shoulder  THERAPY DIAG:  Decreased right shoulder range of motion  Acute pain of right shoulder  Muscle weakness (generalized)  PERTINENT HISTORY: Pre-diabetic, HTN   PRECAUTIONS: Shoulder- no R UE WB.  No UBE per protocol.  SUBJECTIVE:  Pt is 10 weeks s/p R reverse total shoulder. -Pt reports he had some increased pain after Rx though not bad pain.  Pt reports 2.5/10 pain after prior Rx which increased to 3/10 later that day.  Pt states his ROM is much better than it has been in 10 years.  Pt reports he couldn't barely lift his R UE or reach for years prior to surgery.  Pt reports compliance with HEP.  Pt reports he is able to reach forward a little more.  He is able to reach top and back of his head better.  Pt feels pretty good driving though can feel it when  he turns the steering wheel.  He continues to have difficulty sleeping in bed though is a little better.   Pt states he is able to reach to grab a water bottle.  Pt's wife reports he was able to open cans for her.  Pt reports he is able to thread his belt in his pants now.  - Pt is not using sling except when in a large crowd. - Pt is limited with reaching activities and unable to perform overhead or lifting activities.    PAIN:  Are you having pain? Yes NRPS:  2/10 current, Worst pain 5-6/10 Location: R shoulder.     OBJECTIVE:   TODAY'S TREATMENT:       Therapeutic Exercise: -Reviewed response to prior Rx, HEP compliance, current pt presentation, and pain level. -Pt performed: Supine shoulder flexion AROM with head elevated 2x10 reps S/L ER AROM 2x10 reps S/L shoulder Abd with head elevated w/n protocol range 2x10 reps Prone extension w/n protocol range 2x10 reps Standing wall walks in flexion x 10 reps Scap retraction with YTB 2x10 reps  -Pt received R shoulder PROM in flexion, scaption, abduction, and ER in supine per protocol range and pt/tissue tolerance -See Pt education provided below.     PATIENT EDUCATION: Education details:  Post op protocol and restrictions. Education provided concerning exercise form.  Person educated: Patient and Spouse Education method: Explanation, Demonstration, Tactile cues, Verbal cues, Education comprehension: verbalized understanding, returned demonstration, verbal cues required, and tactile cues required     HOME EXERCISE PROGRAM: Access Code: [facility] URL: [email or web address] Date: 07/03/2021 Prepared by: Ronny Flurry  Exercises Seated Scapular Retraction - 3-5 x daily - 7 x weekly - 1 sets - 10 reps Circular Shoulder Pendulum with Table Support - 3-5 x daily - 7 x weekly - 1 sets - 20 reps Standing Elbow Flexion Extension AROM - 3-5 x daily - 7 x weekly - 1 sets - 20 reps Seated Gripping Towel - 3-5 x daily - 7 x  weekly - 1 sets - 20 reps Supine Shoulder External Rotation with Dowel at 20 Degrees of Abduction - 2 x daily - 7 x weekly - 1 sets - 15 reps - 5 hold Supine Shoulder Wand flexion AAROM - 2 x daily - 7 x weekly - 1-2 sets - 10 reps Supine Shoulder Flexion AROM - 2 x daily - 7 x weekly - 2 sets - 10 reps Isometric Shoulder Flexion at Wall - 1 x daily - 5 x weekly - 1-2 sets - 10 reps - 5 seconds hold Isometric Shoulder Abduction at Wall - 1 x daily - 5 x weekly - 1-2 sets - 10 reps - 5 seconds hold Isometric Shoulder Extension at Wall - 1 x daily - 7 x weekly - 1-2 sets - 10 reps - 5 seconds hold Single Arm Serratus Punches - 1 x daily - 6-7 x weekly - 2 sets - 10 reps Supine Shoulder Alphabet - 1-2 x daily - 7 x weekly - 1 reps Prone Shoulder Extension - Single Arm - 1 x daily - 5-6 x weekly - 2 sets - 10 reps Standing Shoulder Flexion Wall Walk - 1-2 x daily - 7 x weekly - 1-2 sets - 5 reps           ASSESSMENT:   CLINICAL IMPRESSION:  Pt continues to progress well with function, ROM, and protocol.  PT progressed exercises per protocol and Pt performed exercises well with cuing for correct form and positioning.  Pt tolerated PROM well.  He continues to have tightness and limitations in PROM though improves with increased reps of PROM.  He responded well to Rx reporting improved pain from 2/10 before Rx to 1/10 after Rx.  Pt should continue to benefit from continued skilled therapy per protocol in order to address ongoing goals and to assist in restoring desired level of function.            Objective impairments include decreased ROM, decreased strength, hypomobility, increased edema, increased fascial restrictions, increased muscle spasms, impaired flexibility, impaired UE functional use, improper body mechanics, postural dysfunction, and pain. These impairments are limiting patient from cleaning, community activity, driving, meal prep, occupation, laundry, yard work, and shopping.             GOALS:     SHORT TERM GOALS:   STG Name Target Date Goal status  1 Pt will become independent with HEP in order to demonstrate synthesis of PT education.    05/16/2021 GOAL MET  2 Pt will score at least 23 pt increase on FOTO to demonstrate functional improvement in MCII and pt perceived function.     06/13/2021 PARTIALLY MET; Pt had 22 pt increase  3 Pt will be able to demonstrate fwd flexion to 130  and ER to 30 in order to demonstrate functional improvement in UE ROM benchmark.    06/13/2021 PROGRESSING  4 Pt will be able to demonstrate full symmetrical ROM in all planes in order to demonstrate functional improvement in UE function for progression to next phase of rehabilitation.     06/13/2021 INITIAL    LONG TERM GOALS:    LTG Name Target Date Goal status  1 Pt  will become independent with final HEP in order to demonstrate synthesis of PT education.     07/25/2021 INITIAL  2 Pt will score >/= 53 on FOTO to demonstrate functional improvement in R shoulder function for return to PLOF.     07/25/2021 INITIAL  3 Pt will be able to reach [facility] and carry/hold >5 lbs in order to demonstrate functional improvement in R UE strength for return to PLOF and exercise.    07/25/2021 INITIAL  4 Pt will be able to demonstrate full AROM in all planes in order to demonstrate functional improvement in UE function for self-care and house hold duties.    07/25/2021 INITIAL    PLAN:    PLANNED INTERVENTIONS: Therapeutic exercises, Therapeutic activity, Neuro Muscular re-education, Patient/Family education, Joint mobilization, Aquatic Therapy, Dry Needling, Electrical stimulation, Spinal mobilization, Cryotherapy, Moist heat, scar mobilization, Splintting, Taping, Vasopneumatic device, Traction, Ultrasound, Ionotophoresis 12m/ml Dexamethasone, and Manual therapy   PLAN FOR NEXT SESSION: Cont with ROM and exercises per MD Reverse TSA protocol.     RSelinda MichaelsIII PT, DPT 07/09/21 8:46  PM

## 2021-07-11 ENCOUNTER — Ambulatory Visit (HOSPITAL_BASED_OUTPATIENT_CLINIC_OR_DEPARTMENT_OTHER): Payer: Medicare Other | Admitting: Physical Therapy

## 2021-07-11 ENCOUNTER — Encounter (HOSPITAL_BASED_OUTPATIENT_CLINIC_OR_DEPARTMENT_OTHER): Payer: Self-pay | Admitting: Physical Therapy

## 2021-07-11 ENCOUNTER — Other Ambulatory Visit: Payer: Self-pay

## 2021-07-11 DIAGNOSIS — M25511 Pain in right shoulder: Secondary | ICD-10-CM

## 2021-07-11 DIAGNOSIS — M6281 Muscle weakness (generalized): Secondary | ICD-10-CM

## 2021-07-11 DIAGNOSIS — M25611 Stiffness of right shoulder, not elsewhere classified: Secondary | ICD-10-CM | POA: Diagnosis not present

## 2021-07-11 NOTE — Therapy (Signed)
OUTPATIENT PHYSICAL THERAPY TREATMENT NOTE      Patient Name: Dwayne Wade MRN: [id number] DOB:27-Sep-1950, 70 y.o., male 13 Date: 07/11/2021  PCP: Alan Ripper, PA REFERRING MD:  Vanetta Mulders, MD  PT End of Session - 07/11/21 0804     Visit Number 19    Number of Visits 21    Date for PT Re-Evaluation 07/31/21    Authorization Type Medicare    PT Start Time 0803    PT Stop Time 2297    PT Time Calculation (min) 44 min    Activity Tolerance Patient tolerated treatment well    Behavior During Therapy [facility] for tasks assessed/performed                       Past Medical History:  Diagnosis Date   Arthritis    Hypertension    Pre-diabetes    Past Surgical History:  Procedure Laterality Date   REVERSE SHOULDER ARTHROPLASTY Right 04/30/2021   Procedure: RIGHT REVERSE SHOULDER ARTHROPLASTY;  Surgeon: Vanetta Mulders, MD;  Location: [facility];  Service: Orthopedics;  Laterality: Right;   TONSILLECTOMY  1990   There are no problems to display for this patient.    REFERRING DIAG:  M12.811 (ICD-10-CM) - Rotator cuff arthropathy of right shoulder  Surgery date 04/30/2021 Total reverse shoulder  THERAPY DIAG:  Decreased right shoulder range of motion  Acute pain of right shoulder  Muscle weakness (generalized)  PERTINENT HISTORY: Pre-diabetic, HTN   PRECAUTIONS: Shoulder- no R UE WB.  No UBE per protocol.  SUBJECTIVE:  Pt is 10 weeks and 2 days s/p R reverse total shoulder. -Pt reports he had some increased pain after Rx though not bad pain.  Pt reports 2.5-3/10 pain after prior Rx which lasted the rest of the day.  Pt states his ROM is much better than it has been in 10 years.  Pt reports he couldn't barely lift his R UE or reach for years prior to surgery.  Pt reports compliance with HEP and has 3/10 pain after performing HEP. Pt reports he is able to reach forward a little more.  He is able to reach top and back of his head better.  Pt  feels pretty good driving though can feel it when he turns the steering wheel.  He continues to have difficulty sleeping in bed though is a little better.   Pt states he is able to reach to grab a water bottle.  Pt's wife reports he was able to open cans for her.  Pt reports he is able to thread his belt in his pants now.  Pt's wife reports he is waking up during the night due to shoulder pain and tightness.   - Pt is not using sling except when in a large crowd. - Pt is limited with reaching activities and unable to perform overhead or lifting activities.    PAIN:  Are you having pain? Yes NRPS:  2/10 current, Worst pain 5-6/10 Location: R shoulder.     OBJECTIVE:   TODAY'S TREATMENT:       Therapeutic Exercise: -Reviewed response to prior Rx, HEP compliance, current pt presentation, and pain level. -Pt performed: Supine shoulder flexion AROM with head elevated 2x10 reps S/L ER AROM 2x10 reps S/L shoulder Abd with head elevated w/n protocol range 2x10 reps Standing wand flexion 2 x 10 reps Standing wand scaption 2x10 reps Standing jobe's flexion AROM 2x10 reps Scap retraction with YTB 2x10 reps  -Pt received  R shoulder PROM in flexion, scaption, abduction, and ER in supine per protocol range and pt/tissue tolerance -See Pt education provided below.     PATIENT EDUCATION: Education details:  Post op protocol and restrictions. Education provided concerning exercise form.  Person educated: Patient and Spouse Education method: Explanation, Demonstration, Tactile cues, Verbal cues, Education comprehension: verbalized understanding, returned demonstration, verbal cues required, and tactile cues required     HOME EXERCISE PROGRAM: Access Code: [facility] URL: [email or web address] Date: 07/11/2021 Prepared by: Ronny Flurry  Exercises Seated Scapular Retraction - 3-5 x daily - 7 x weekly - 1 sets - 10 reps Circular Shoulder Pendulum with Table Support - 3-5 x daily  - 7 x weekly - 1 sets - 20 reps Standing Elbow Flexion Extension AROM - 3-5 x daily - 7 x weekly - 1 sets - 20 reps Seated Gripping Towel - 3-5 x daily - 7 x weekly - 1 sets - 20 reps Supine Shoulder External Rotation with Dowel at 20 Degrees of Abduction - 2 x daily - 7 x weekly - 1 sets - 15 reps - 5 hold Supine Shoulder Wand flexion AAROM - 2 x daily - 7 x weekly - 1-2 sets - 10 reps Supine Shoulder Flexion AROM - 2 x daily - 7 x weekly - 2 sets - 10 reps Isometric Shoulder Flexion at Wall - 1 x daily - 5 x weekly - 1-2 sets - 10 reps - 5 seconds hold Isometric Shoulder Abduction at Wall - 1 x daily - 5 x weekly - 1-2 sets - 10 reps - 5 seconds hold Isometric Shoulder Extension at Wall - 1 x daily - 7 x weekly - 1-2 sets - 10 reps - 5 seconds hold Single Arm Serratus Punches - 1 x daily - 6-7 x weekly - 2 sets - 10 reps Supine Shoulder Alphabet - 1-2 x daily - 7 x weekly - 1 reps Prone Shoulder Extension - Single Arm - 1 x daily - 5-6 x weekly - 2 sets - 10 reps Standing Shoulder Flexion Wall Walk - 2 x daily - 7 x weekly - 1 sets - 10 reps Standing Shoulder Flexion AAROM with Dowel - 2 x daily - 7 x weekly - 1 sets - 10 reps            ASSESSMENT:   CLINICAL IMPRESSION:  Pt continues to progress well with function, ROM, and protocol.  Pt is improving with standing flexion AAROM and AROM.  He performed standing flexion AROM well to 90 deg.  He had difficulty with performing standing wand scaption.  Pt tolerated PROM well.  He continues to have tightness and limitations in PROM though improves with increased reps of PROM.  He responded well to Rx having no increased pain after Rx.  Pt should continue to benefit from continued skilled therapy per protocol in order to address ongoing goals and to assist in restoring desired level of function.            Objective impairments include decreased ROM, decreased strength, hypomobility, increased edema, increased fascial restrictions,  increased muscle spasms, impaired flexibility, impaired UE functional use, improper body mechanics, postural dysfunction, and pain. These impairments are limiting patient from cleaning, community activity, driving, meal prep, occupation, laundry, yard work, and shopping.            GOALS:     SHORT TERM GOALS:   STG Name Target Date Goal status  1 Pt will become independent with HEP in order  to demonstrate synthesis of PT education.    05/16/2021 GOAL MET  2 Pt will score at least 23 pt increase on FOTO to demonstrate functional improvement in MCII and pt perceived function.     06/13/2021 PARTIALLY MET; Pt had 22 pt increase  3 Pt will be able to demonstrate fwd flexion to 130 and ER to 30 in order to demonstrate functional improvement in UE ROM benchmark.    06/13/2021 PROGRESSING  4 Pt will be able to demonstrate full symmetrical ROM in all planes in order to demonstrate functional improvement in UE function for progression to next phase of rehabilitation.     06/13/2021 INITIAL    LONG TERM GOALS:    LTG Name Target Date Goal status  1 Pt  will become independent with final HEP in order to demonstrate synthesis of PT education.     07/25/2021 INITIAL  2 Pt will score >/= 53 on FOTO to demonstrate functional improvement in R shoulder function for return to PLOF.     07/25/2021 INITIAL  3 Pt will be able to reach [facility] and carry/hold >5 lbs in order to demonstrate functional improvement in R UE strength for return to PLOF and exercise.    07/25/2021 INITIAL  4 Pt will be able to demonstrate full AROM in all planes in order to demonstrate functional improvement in UE function for self-care and house hold duties.    07/25/2021 INITIAL    PLAN:    PLANNED INTERVENTIONS: Therapeutic exercises, Therapeutic activity, Neuro Muscular re-education, Patient/Family education, Joint mobilization, Aquatic Therapy, Dry Needling, Electrical stimulation, Spinal mobilization, Cryotherapy, Moist heat,  scar mobilization, Splintting, Taping, Vasopneumatic device, Traction, Ultrasound, Ionotophoresis 29m/ml Dexamethasone, and Manual therapy   PLAN FOR NEXT SESSION: Cont with ROM and exercises per MD Reverse TSA protocol.     RSelinda MichaelsIII PT, DPT 07/11/21 2:39 PM

## 2021-07-15 ENCOUNTER — Ambulatory Visit (HOSPITAL_BASED_OUTPATIENT_CLINIC_OR_DEPARTMENT_OTHER): Payer: Medicare Other | Attending: Orthopaedic Surgery | Admitting: Physical Therapy

## 2021-07-15 ENCOUNTER — Encounter (HOSPITAL_BASED_OUTPATIENT_CLINIC_OR_DEPARTMENT_OTHER): Payer: Self-pay | Admitting: Physical Therapy

## 2021-07-15 ENCOUNTER — Other Ambulatory Visit: Payer: Self-pay

## 2021-07-15 DIAGNOSIS — M6281 Muscle weakness (generalized): Secondary | ICD-10-CM | POA: Insufficient documentation

## 2021-07-15 DIAGNOSIS — M25511 Pain in right shoulder: Secondary | ICD-10-CM | POA: Insufficient documentation

## 2021-07-15 DIAGNOSIS — M12811 Other specific arthropathies, not elsewhere classified, right shoulder: Secondary | ICD-10-CM | POA: Insufficient documentation

## 2021-07-15 DIAGNOSIS — M25611 Stiffness of right shoulder, not elsewhere classified: Secondary | ICD-10-CM | POA: Insufficient documentation

## 2021-07-15 NOTE — Therapy (Addendum)
OUTPATIENT PHYSICAL THERAPY TREATMENT NOTE/PROGRESS NOTE    Progress Note Reporting Period 06/10/2021 to 07/15/2021  See note below for Objective Data and Assessment of Progress/Goals.        Patient Name: Dwayne Wade MRN: [id number] DOB:25-Oct-1950, 71 y.o., male Today's Date: 07/15/2021  PCP: Alan Ripper, PA REFERRING MD:  Vanetta Mulders, MD  PT End of Session - 07/15/21 [phone]     Visit Number 20    Number of Visits 32    Date for PT Re-Evaluation 08/26/21    Authorization Type Medicare    Progress Note Due on Visit 59    PT Start Time 0843    PT Stop Time 0933    PT Time Calculation (min) 50 min    Activity Tolerance Patient tolerated treatment well    Behavior During Therapy [facility] for tasks assessed/performed                        Past Medical History:  Diagnosis Date   Arthritis    Hypertension    Pre-diabetes    Past Surgical History:  Procedure Laterality Date   REVERSE SHOULDER ARTHROPLASTY Right 04/30/2021   Procedure: RIGHT REVERSE SHOULDER ARTHROPLASTY;  Surgeon: Vanetta Mulders, MD;  Location: [facility];  Service: Orthopedics;  Laterality: Right;   TONSILLECTOMY  1990   There are no problems to display for this patient.    REFERRING DIAG:  M12.811 (ICD-10-CM) - Rotator cuff arthropathy of right shoulder  Surgery date 04/30/2021 Total reverse shoulder  THERAPY DIAG:  Decreased right shoulder range of motion  Acute pain of right shoulder  Muscle weakness (generalized)  PERTINENT HISTORY: Pre-diabetic, HTN   PRECAUTIONS: Shoulder- no R UE WB.  No UBE per protocol.  SUBJECTIVE:  Pt is 10 weeks and 6 days s/p R reverse total shoulder. -Pt reports he had some increased pain after Rx though no adverse effects.  Pt reports 2/10 pain after prior Rx.  Pt states his ROM is much better than it has been in 10 years.  Pt reports compliance with HEP and has increased pain to 3-4/10 after performing HEP.  Pt's wife states they have  not been performing wand ER home exercise due to not sure on form and pt's pain.   -Pt states he may have done too much this weekend including vacuuming car and playing with grandchildren.  Pt also went to church.  Pt's wife reports he had a rough weekend.  Pt also did some sweeping and mopping yesterday.  Pt had difficulty sleeping last night.   -Pt has been showing people how much higher he can lift his arm.  Pt able to make bed.  Pt does have some pain with turning steering wheel though is improving.  He is doing minimal driving.  Pt states he is able to reach to grab a water bottle.  Pt reports he is able to thread his belt in his pants now.  Pt is shaving now instead of his wife having to do it. - Pt is not using sling except when in a large crowd.   FUNCTIONAL LIMITATIONS:  lifting objects including grocery bags, sleeping, carrying objects, vacuuming/mopping, household cleaning/IADLs, reaching, and overhead activities.    PAIN:  Are you having pain? Yes NRPS:  2/10 current, Worst pain 3-4/10 Location: R shoulder.     OBJECTIVE:   TODAY'S TREATMENT:     FOTO:  51     L shoulder AROM/PROM:    Flexion:  117 deg in standing and 125 deg in supine/ 146 deg    ER:  30/41 deg     Therapeutic Exercise: -Reviewed response to prior Rx, HEP compliance, reported functional progress and deficits, and pain level. -Assessed R shoulder ROM -Pt performed: Supine serratus punches 2x10 reps Prone shoulder extension 2x10 reps S/L ER AROM 2x10 reps S/L shoulder Abd with head elevated w/n protocol range 2x10 reps Supine wand ER x 10 reps and seated wand ER 2x10 reps Standing wand flexion 2 x 10 reps Standing wand scaption 2x10 reps Standing jobe's flexion AROM 2x10 reps Standing should flexion AROM x 10 reps in front of mirror x 10 reps Scap retraction with YTB 2x10 reps  -Pt received R shoulder PROM in flexion, scaption, abduction, and ER in supine per protocol range and pt/tissue  tolerance -See Pt education provided below.     PATIENT EDUCATION: Education details:  Instructed pt and wife that he should avoid mopping and sweeping currently.  Reviewed wand ER and educated pt and wife in correct form.  Post op protocol and restrictions. Objective findings.  Education provided concerning exercise form.  Person educated: Patient and Spouse Education method: Explanation, Demonstration, Tactile cues, Verbal cues, Education comprehension: verbalized understanding, returned demonstration, verbal cues required, and tactile cues required     HOME EXERCISE PROGRAM: Access Code: [facility] URL: [email or web address] Date: 07/11/2021 Prepared by: Ronny Flurry  Exercises Seated Scapular Retraction - 3-5 x daily - 7 x weekly - 1 sets - 10 reps Circular Shoulder Pendulum with Table Support - 3-5 x daily - 7 x weekly - 1 sets - 20 reps Standing Elbow Flexion Extension AROM - 3-5 x daily - 7 x weekly - 1 sets - 20 reps Seated Gripping Towel - 3-5 x daily - 7 x weekly - 1 sets - 20 reps Supine Shoulder External Rotation with Dowel at 20 Degrees of Abduction - 2 x daily - 7 x weekly - 1 sets - 15 reps - 5 hold Supine Shoulder Wand flexion AAROM - 2 x daily - 7 x weekly - 1-2 sets - 10 reps Supine Shoulder Flexion AROM - 2 x daily - 7 x weekly - 2 sets - 10 reps Isometric Shoulder Flexion at Wall - 1 x daily - 5 x weekly - 1-2 sets - 10 reps - 5 seconds hold Isometric Shoulder Abduction at Wall - 1 x daily - 5 x weekly - 1-2 sets - 10 reps - 5 seconds hold Isometric Shoulder Extension at Wall - 1 x daily - 7 x weekly - 1-2 sets - 10 reps - 5 seconds hold Single Arm Serratus Punches - 1 x daily - 6-7 x weekly - 2 sets - 10 reps Supine Shoulder Alphabet - 1-2 x daily - 7 x weekly - 1 reps Prone Shoulder Extension - Single Arm - 1 x daily - 5-6 x weekly - 2 sets - 10 reps Standing Shoulder Flexion Wall Walk - 2 x daily - 7 x weekly - 1 sets - 10 reps Standing Shoulder  Flexion AAROM with Dowel - 2 x daily - 7 x weekly - 1 sets - 10 reps            ASSESSMENT:   CLINICAL IMPRESSION:  Pt continues to progress well with function, ROM, and protocol.  Pt is improving with standing flexion AAROM and AROM.  He performed standing flexion AROM well to 90 deg.  He had difficulty with performing standing wand scaption.  Pt tolerated PROM  well.  He continues to have tightness and limitations in PROM though improves with increased reps of PROM.  He responded well to Rx having no increased pain after Rx.  Pt should continue to benefit from continued skilled therapy per protocol in order to address ongoing goals and to assist in restoring desired level of function.    Pt is making good progress in all areas and is progressing appropriately with protocol.  He and his wife are very pleased with his progress and state this is the most shoulder motion he's had in 10 years.  Pt has expected tightness and limitations with R shoulder ROM and continues to improve per protocol.  Pt demonstrates improved elevation though does require cuing for form and has compensatory shoulder hike.  He is very motivated and gives great effort with all exercises.  Pt is increasing functional usage of R UE having improved performance of self care activities, reaching, and ADLs.  Pt demonstrates improved self perceived disability with FOTO improving from prior 36 to currently 51.  Pt continues to have expected limitations including IADLs, reaching, lifting, and overhead activities.  Pt is progressing toward goals and should cont to benefit from cont skilled PT services per protocol to address ongoing goals and to restore desired level of function.           Objective impairments include decreased ROM, decreased strength, hypomobility, increased edema, increased fascial restrictions, increased muscle spasms, impaired flexibility, impaired UE functional use, improper body mechanics, postural dysfunction,  and pain. These impairments are limiting patient from cleaning, community activity, driving, meal prep, occupation, laundry, yard work, and shopping.            GOALS:     SHORT TERM GOALS:   STG Name Target Date Goal status  1 Pt will become independent with HEP in order to demonstrate synthesis of PT education.    05/16/2021 GOAL MET  2 Pt will score at least 23 pt increase on FOTO to demonstrate functional improvement in MCII and pt perceived function.     06/13/2021 GOAL MET  3 Pt will be able to demonstrate fwd flexion to 130 and ER to 30 in order to demonstrate functional improvement in UE ROM benchmark.    06/13/2021 75%  4 Pt will be able to demonstrate full symmetrical ROM in all planes in order to demonstrate functional improvement in UE function for progression to next phase of rehabilitation.     06/13/2021 INITIAL    LONG TERM GOALS:    LTG Name Target Date Goal status  1 Pt  will become independent with final HEP in order to demonstrate synthesis of PT education.     08/26/2021 INITIAL  2 Pt will score >/= 53 on FOTO to demonstrate functional improvement in R shoulder function for return to PLOF.     08/26/2021 99%  3 Pt will be able to reach [facility] and carry/hold >5 lbs in order to demonstrate functional improvement in R UE strength for return to PLOF and exercise.    08/26/2021 INITIAL  4 Pt will be able to demonstrate full AROM in all planes in order to demonstrate functional improvement in UE function for self-care and house hold duties.    08/26/2021 INITIAL    PLAN: Frequency:  2 times per week  Duration:  6 weeks   PLANNED INTERVENTIONS: Therapeutic exercises, Therapeutic activity, Neuro Muscular re-education, Patient/Family education, Joint mobilization, Aquatic Therapy, Dry Needling, Electrical stimulation, Spinal mobilization, Cryotherapy, Moist heat, scar mobilization,  Splintting, Taping, Vasopneumatic device, Traction, Ultrasound, Ionotophoresis 29m/ml  Dexamethasone, and Manual therapy   PLAN FOR NEXT SESSION: Cont with ROM and exercises per MD Reverse TSA protocol.     RSelinda MichaelsIII PT, DPT 07/15/21 9:25 PM

## 2021-07-17 ENCOUNTER — Encounter (HOSPITAL_BASED_OUTPATIENT_CLINIC_OR_DEPARTMENT_OTHER): Payer: Self-pay | Admitting: Physical Therapy

## 2021-07-17 ENCOUNTER — Ambulatory Visit (HOSPITAL_BASED_OUTPATIENT_CLINIC_OR_DEPARTMENT_OTHER): Payer: Medicare Other | Admitting: Physical Therapy

## 2021-07-17 ENCOUNTER — Other Ambulatory Visit: Payer: Self-pay

## 2021-07-17 DIAGNOSIS — M25611 Stiffness of right shoulder, not elsewhere classified: Secondary | ICD-10-CM

## 2021-07-17 DIAGNOSIS — M6281 Muscle weakness (generalized): Secondary | ICD-10-CM

## 2021-07-17 DIAGNOSIS — M25511 Pain in right shoulder: Secondary | ICD-10-CM

## 2021-07-17 NOTE — Therapy (Signed)
OUTPATIENT PHYSICAL THERAPY TREATMENT NOTE          Patient Name: Dwayne Wade MRN: [id number] DOB:1951-03-04, 71 y.o., male 68 Date: 07/17/2021  PCP: Alan Ripper, PA REFERRING MD:  Vanetta Mulders, MD  PT End of Session - 07/17/21 0844     Visit Number 21    Number of Visits 32    Date for PT Re-Evaluation 08/26/21    Authorization Type Medicare    Progress Note Due on Visit 32    PT Start Time 0807    PT Stop Time 0851    PT Time Calculation (min) 44 min    Activity Tolerance Patient tolerated treatment well    Behavior During Therapy [facility] for tasks assessed/performed                         Past Medical History:  Diagnosis Date   Arthritis    Hypertension    Pre-diabetes    Past Surgical History:  Procedure Laterality Date   REVERSE SHOULDER ARTHROPLASTY Right 04/30/2021   Procedure: RIGHT REVERSE SHOULDER ARTHROPLASTY;  Surgeon: Vanetta Mulders, MD;  Location: [place];  Service: Orthopedics;  Laterality: Right;   TONSILLECTOMY  1990   There are no problems to display for this patient.    REFERRING DIAG:  M12.811 (ICD-10-CM) - Rotator cuff arthropathy of right shoulder  Surgery date 04/30/2021 Total reverse shoulder  THERAPY DIAG:  Decreased right shoulder range of motion  Acute pain of right shoulder  Muscle weakness (generalized)  PERTINENT HISTORY: Pre-diabetic, HTN   PRECAUTIONS: Shoulder- no R UE WB.  No UBE per protocol.  SUBJECTIVE:  Pt is 11 weeks and 1 day s/p R reverse total shoulder.   -Pt has been showing people how much higher he can lift his arm.  Pt able to make bed.  Pt does have some pain with turning steering wheel though is improving.  He is doing minimal driving.  Pt states he is able to reach to grab a water bottle.  Pt reports he is able to thread his belt in his pants now.  Pt is shaving now instead of his wife having to do it. - Pt is not using sling except when in a large crowd.  Pt tried to  use a machine to clean the floor that causes vibration in his UE's.  Pt had pain and was unable to use the machine.  He states he tried the machine for 30 sec to 1 min.  Pt reports he had pain in his R UE which lasted for about 5 mins while he was just sitting and watching TV last night.  Pt had no adverse effects after prior Rx reporting a 2.5/10 pain after Rx.  Pt was able to lie on R side when sleeping last night.  Pt reports sometimes he has no pain with HEP and other times he has 2.5/10 pain.  Pt reports having tightness in R shoulder.    FUNCTIONAL LIMITATIONS:  lifting objects including grocery bags, sleeping, carrying objects, vacuuming/mopping, household cleaning/IADLs, reaching, and overhead activities.   PAIN:  Are you having pain? Yes NRPS:  1/10 current, Worst pain 3-4/10 Location: R shoulder.     OBJECTIVE:   TODAY'S TREATMENT:     Therapeutic Exercise: -Reviewed response to prior Rx, HEP compliance, reported functional progress and deficits, and pain level. -Assessed R shoulder ROM -Pt performed: Prone shoulder extension to neutral 2x10 reps Prone row to neutral 2x10 reps S/L  ER AROM 2x10 reps S/L shoulder Abd with head elevated w/n protocol range 2x10 reps Supine wand ER x 10 reps  Standing wand scaption 2x10 reps Standing jobe's flexion AROM 2x10 reps Standing should flexion AROM x 10 reps in front of mirror Scap retraction with YTB 2x10 reps -Reviewed and updated HEP  -Pt received R shoulder PROM in flexion, scaption, abduction, and ER in supine per protocol range and pt/tissue tolerance -See Pt education provided below.     PATIENT EDUCATION: Education details:  Instructed pt and wife that he should not use the cleaning machine that causes vibration.    Reviewed wand ER and educated pt and wife in correct form.  Reviewed and updated HEP.  Pt received a HEP handout consisting of S/L ER and was educated in correct form and appropriate frequency.  Post op protocol  and restrictions. Objective findings.  Education provided concerning exercise form.  Person educated: Patient and Spouse Education method: Explanation, Demonstration, Tactile cues, Verbal cues, Education comprehension: verbalized understanding, returned demonstration, verbal cues required, and tactile cues required     HOME EXERCISE PROGRAM: Access Code: [facility] URL: [email or web address] Date: 07/17/2021 Prepared by: Ronny Flurry  Exercises Seated Scapular Retraction - 3-5 x daily - 7 x weekly - 1 sets - 10 reps Circular Shoulder Pendulum with Table Support - 3-5 x daily - 7 x weekly - 1 sets - 20 reps Standing Elbow Flexion Extension AROM - 3-5 x daily - 7 x weekly - 1 sets - 20 reps Supine Shoulder External Rotation with Dowel at 20 Degrees of Abduction - 2 x daily - 7 x weekly - 1 sets - 15 reps - 5 hold Supine Shoulder Wand flexion AAROM - 2 x daily - 7 x weekly - 1-2 sets - 10 reps Supine Shoulder Flexion AROM - 2 x daily - 7 x weekly - 2 sets - 10 reps Isometric Shoulder Flexion at Wall - 1 x daily - 5 x weekly - 1-2 sets - 10 reps - 5 seconds hold Isometric Shoulder Abduction at Wall - 1 x daily - 5 x weekly - 1-2 sets - 10 reps - 5 seconds hold Isometric Shoulder Extension at Wall - 1 x daily - 7 x weekly - 1-2 sets - 10 reps - 5 seconds hold Single Arm Serratus Punches - 1 x daily - 6-7 x weekly - 2 sets - 10 reps Supine Shoulder Alphabet - 1-2 x daily - 7 x weekly - 1 reps Prone Shoulder Extension - Single Arm - 1 x daily - 5-6 x weekly - 2 sets - 10 reps Standing Shoulder Flexion Wall Walk - 2 x daily - 7 x weekly - 1 sets - 10 reps Standing Shoulder Flexion AAROM with Dowel - 2 x daily - 7 x weekly - 1 sets - 10 reps Sidelying Shoulder External Rotation - 1 x daily - 5-6 x weekly - 2 sets - 10 reps             ASSESSMENT:   CLINICAL IMPRESSION: Pt is making good progress in all areas and is progressing appropriately with protocol.  He and his wife  are very pleased with his progress.  Pt has expected tightness and limitations with R shoulder ROM and continues to improve per protocol.  Pt is improving with UE elevation though does require cuing for form and has compensatory shoulder hike.  Pt able to perform jobe's flexion to 90 deg well.  He is very motivated and gives great effort  with all exercises.  Pt performed exercises per protocol well with cuing for correct form.  PT reviewed HEP and educated pt with correct exercises and appropriate frequency. Pt is increasing functional usage of R UE having improved performance of self care activities, reaching, and ADLs.  Pt continues to have expected limitations including IADLs, reaching, lifting, and overhead activities.  Pt responded well to Rx reporting minimally increased pain to 1.5 - 2/10 after Rx.  He should cont to benefit from cont skilled PT services per protocol to address ongoing goals and to restore desired level of function.              Objective impairments include decreased ROM, decreased strength, hypomobility, increased edema, increased fascial restrictions, increased muscle spasms, impaired flexibility, impaired UE functional use, improper body mechanics, postural dysfunction, and pain. These impairments are limiting patient from cleaning, community activity, driving, meal prep, occupation, laundry, yard work, and shopping.            GOALS:     SHORT TERM GOALS:   STG Name Target Date Goal status  1 Pt will become independent with HEP in order to demonstrate synthesis of PT education.    05/16/2021 GOAL MET  2 Pt will score at least 23 pt increase on FOTO to demonstrate functional improvement in MCII and pt perceived function.     06/13/2021 GOAL MET  3 Pt will be able to demonstrate fwd flexion to 130 and ER to 30 in order to demonstrate functional improvement in UE ROM benchmark.    06/13/2021 75%  4 Pt will be able to demonstrate full symmetrical ROM in all planes in order  to demonstrate functional improvement in UE function for progression to next phase of rehabilitation.     06/13/2021 INITIAL    LONG TERM GOALS:    LTG Name Target Date Goal status  1 Pt  will become independent with final HEP in order to demonstrate synthesis of PT education.     08/26/2021 INITIAL  2 Pt will score >/= 53 on FOTO to demonstrate functional improvement in R shoulder function for return to PLOF.     08/26/2021 99%  3 Pt will be able to reach [facility] and carry/hold >5 lbs in order to demonstrate functional improvement in R UE strength for return to PLOF and exercise.    08/26/2021 INITIAL  4 Pt will be able to demonstrate full AROM in all planes in order to demonstrate functional improvement in UE function for self-care and house hold duties.    08/26/2021 INITIAL    PLAN: Frequency:  2 times per week  Duration:  6 weeks   PLANNED INTERVENTIONS: Therapeutic exercises, Therapeutic activity, Neuro Muscular re-education, Patient/Family education, Joint mobilization, Aquatic Therapy, Dry Needling, Electrical stimulation, Spinal mobilization, Cryotherapy, Moist heat, scar mobilization, Splintting, Taping, Vasopneumatic device, Traction, Ultrasound, Ionotophoresis 23m/ml Dexamethasone, and Manual therapy   PLAN FOR NEXT SESSION: Cont with ROM and exercises per MD Reverse TSA protocol.     RSelinda MichaelsIII PT, DPT 07/17/21 11:48 AM

## 2021-07-22 ENCOUNTER — Other Ambulatory Visit: Payer: Self-pay

## 2021-07-22 ENCOUNTER — Ambulatory Visit (HOSPITAL_BASED_OUTPATIENT_CLINIC_OR_DEPARTMENT_OTHER): Payer: Medicare Other | Admitting: Physical Therapy

## 2021-07-22 ENCOUNTER — Encounter (HOSPITAL_BASED_OUTPATIENT_CLINIC_OR_DEPARTMENT_OTHER): Payer: BC Managed Care – PPO | Admitting: Orthopaedic Surgery

## 2021-07-22 ENCOUNTER — Encounter (HOSPITAL_BASED_OUTPATIENT_CLINIC_OR_DEPARTMENT_OTHER): Payer: Self-pay | Admitting: Physical Therapy

## 2021-07-22 DIAGNOSIS — M6281 Muscle weakness (generalized): Secondary | ICD-10-CM

## 2021-07-22 DIAGNOSIS — M25611 Stiffness of right shoulder, not elsewhere classified: Secondary | ICD-10-CM | POA: Diagnosis not present

## 2021-07-22 DIAGNOSIS — M25511 Pain in right shoulder: Secondary | ICD-10-CM

## 2021-07-22 NOTE — Therapy (Signed)
OUTPATIENT PHYSICAL THERAPY TREATMENT NOTE          Patient Name: Dwayne Wade MRN: [id number] DOB:1950/10/19, 71 y.o., male 16 Date: 07/22/2021  PCP: Alan Ripper, PA REFERRING MD:  Vanetta Mulders, MD  PT End of Session - 07/22/21 1017     Visit Number 22    Number of Visits 32    Date for PT Re-Evaluation 08/26/21    Authorization Type Medicare    Progress Note Due on Visit 56    PT Start Time 0934    PT Stop Time 1015    PT Time Calculation (min) 41 min    Activity Tolerance Patient tolerated treatment well    Behavior During Therapy [facility] for tasks assessed/performed                Past Medical History:  Diagnosis Date   Arthritis    Hypertension    Pre-diabetes    Past Surgical History:  Procedure Laterality Date   REVERSE SHOULDER ARTHROPLASTY Right 04/30/2021   Procedure: RIGHT REVERSE SHOULDER ARTHROPLASTY;  Surgeon: Vanetta Mulders, MD;  Location: [place];  Service: Orthopedics;  Laterality: Right;   TONSILLECTOMY  1990   There are no problems to display for this patient.    REFERRING DIAG:  M12.811 (ICD-10-CM) - Rotator cuff arthropathy of right shoulder  Surgery date 04/30/2021 Total reverse shoulder  THERAPY DIAG:  Decreased right shoulder range of motion  Acute pain of right shoulder  Muscle weakness (generalized)  PERTINENT HISTORY: Pre-diabetic, HTN   PRECAUTIONS: Shoulder- no R UE WB.  No UBE per protocol.  SUBJECTIVE:  Pt is 11 weeks and 6 days s/p R reverse total shoulder.   - Pt is not using sling except when in a large crowd such as church.  Pt reports he felt good after prior Rx pain later was 2.5/10 pain.  Pt states he feels better with the home exercises but does have some soreness and pain after exercises.  Pt continues to have difficulty sleeping and reports cervical pain.  Pt's wife reports he was unable to lift his R UE for a long time prior to surgery and was just shrugging his R shoulder.     FUNCTIONAL IMPROVEMENTS:  making bed, reaching to grab a water bottle, able to thread belt in his pants, shaving FUNCTIONAL LIMITATIONS:  lifting objects including grocery bags, sleeping, carrying objects, vacuuming/mopping, household cleaning/IADLs, reaching, and overhead activities.   PAIN:  Are you having pain? Yes NRPS:  2/10 current, Worst pain 3-4/10 Location: R shoulder.     OBJECTIVE:   TODAY'S TREATMENT:     Therapeutic Exercise: -Reviewed response to prior Rx, HEP compliance, reported functional progress and deficits, and pain level. -Pt performed: Prone shoulder extension to neutral 2x10 reps Prone row to neutral 2x10 reps S/L ER AROM 2x10 reps S/L shoulder Abd with head elevated w/n protocol range 2x10 reps Standing wand scaption 2x10 reps Standing jobe's flexion AROM 2x10 reps Standing should flexion AROM x 10 reps in front of mirror Standing scaption AROM 2x10 reps in front of mirror Scap retraction with YTB 2x10 reps Shelf reach x 10 reps and x 5 reps  -Pt received R shoulder PROM in flexion, scaption, abduction, and ER in supine per protocol range and pt/tissue tolerance -See Pt education provided below.     PATIENT EDUCATION: Education details:  Educated pt in not performing exercises or range that causes pain to be > 2-3/10.  HEP.  Education provided concerning exercise form.  Person educated: Patient and Spouse Education method: Explanation, Demonstration, Tactile cues, Verbal cues, Education comprehension: verbalized understanding, returned demonstration, verbal cues required, and tactile cues required     HOME EXERCISE PROGRAM: Access Code: [facility] URL: [email or web address] Date: 07/17/2021 Prepared by: Ronny Flurry  Exercises Seated Scapular Retraction - 3-5 x daily - 7 x weekly - 1 sets - 10 reps Circular Shoulder Pendulum with Table Support - 3-5 x daily - 7 x weekly - 1 sets - 20 reps Standing Elbow Flexion Extension AROM  - 3-5 x daily - 7 x weekly - 1 sets - 20 reps Supine Shoulder External Rotation with Dowel at 20 Degrees of Abduction - 2 x daily - 7 x weekly - 1 sets - 15 reps - 5 hold Supine Shoulder Wand flexion AAROM - 2 x daily - 7 x weekly - 1-2 sets - 10 reps Supine Shoulder Flexion AROM - 2 x daily - 7 x weekly - 2 sets - 10 reps Isometric Shoulder Flexion at Wall - 1 x daily - 5 x weekly - 1-2 sets - 10 reps - 5 seconds hold Isometric Shoulder Abduction at Wall - 1 x daily - 5 x weekly - 1-2 sets - 10 reps - 5 seconds hold Isometric Shoulder Extension at Wall - 1 x daily - 7 x weekly - 1-2 sets - 10 reps - 5 seconds hold Single Arm Serratus Punches - 1 x daily - 6-7 x weekly - 2 sets - 10 reps Supine Shoulder Alphabet - 1-2 x daily - 7 x weekly - 1 reps Prone Shoulder Extension - Single Arm - 1 x daily - 5-6 x weekly - 2 sets - 10 reps Standing Shoulder Flexion Wall Walk - 2 x daily - 7 x weekly - 1 sets - 10 reps Standing Shoulder Flexion AAROM with Dowel - 2 x daily - 7 x weekly - 1 sets - 10 reps Sidelying Shoulder External Rotation - 1 x daily - 5-6 x weekly - 2 sets - 10 reps             ASSESSMENT:   CLINICAL IMPRESSION: Pt is making good progress in all areas and is progressing appropriately with protocol.  He and his wife are very pleased with his progress.  Pt has expected tightness and limitations with R shoulder ROM and continues to improve per protocol.  He is very motivated and gives great effort with all exercises.  Pt performed exercises per protocol well and demonstrates improved form.  Pt was able to perform standing scaption AROM well and did fatigue with increased shoulder hike toward the end of 2nd set.  Pt able to perform standing scaption AROM better than standing wand scaption.  Pt continues to have limitations with UE elevation though is improving.  Pt continues to have expected limitations including IADLs, reaching, lifting, and overhead activities.  Pt responded well to  Rx having no increased pain 1.5-2/10 after Rx.  He should cont to benefit from cont skilled PT services per protocol to address ongoing goals and to restore desired level of function.      Objective impairments include decreased ROM, decreased strength, hypomobility, increased edema, increased fascial restrictions, increased muscle spasms, impaired flexibility, impaired UE functional use, improper body mechanics, postural dysfunction, and pain. These impairments are limiting patient from cleaning, community activity, driving, meal prep, occupation, laundry, yard work, and shopping.            GOALS:     SHORT TERM GOALS:  STG Name Target Date Goal status  1 Pt will become independent with HEP in order to demonstrate synthesis of PT education.    05/16/2021 GOAL MET  2 Pt will score at least 23 pt increase on FOTO to demonstrate functional improvement in MCII and pt perceived function.     06/13/2021 GOAL MET  3 Pt will be able to demonstrate fwd flexion to 130 and ER to 30 in order to demonstrate functional improvement in UE ROM benchmark.    06/13/2021 75%  4 Pt will be able to demonstrate full symmetrical ROM in all planes in order to demonstrate functional improvement in UE function for progression to next phase of rehabilitation.     06/13/2021 INITIAL    LONG TERM GOALS:    LTG Name Target Date Goal status  1 Pt  will become independent with final HEP in order to demonstrate synthesis of PT education.     08/26/2021 INITIAL  2 Pt will score >/= 53 on FOTO to demonstrate functional improvement in R shoulder function for return to PLOF.     08/26/2021 99%  3 Pt will be able to reach [facility] and carry/hold >5 lbs in order to demonstrate functional improvement in R UE strength for return to PLOF and exercise.    08/26/2021 INITIAL  4 Pt will be able to demonstrate full AROM in all planes in order to demonstrate functional improvement in UE function for self-care and house hold duties.     08/26/2021 INITIAL    PLAN:   PLANNED INTERVENTIONS: Therapeutic exercises, Therapeutic activity, Neuro Muscular re-education, Patient/Family education, Joint mobilization, Aquatic Therapy, Dry Needling, Electrical stimulation, Spinal mobilization, Cryotherapy, Moist heat, scar mobilization, Splintting, Taping, Vasopneumatic device, Traction, Ultrasound, Ionotophoresis 45m/ml Dexamethasone, and Manual therapy   PLAN FOR NEXT SESSION: Cont with ROM and exercises per MD Reverse TSA protocol.     RSelinda MichaelsIII PT, DPT 07/22/21 1:58 PM

## 2021-07-24 ENCOUNTER — Other Ambulatory Visit: Payer: Self-pay

## 2021-07-24 ENCOUNTER — Ambulatory Visit (HOSPITAL_BASED_OUTPATIENT_CLINIC_OR_DEPARTMENT_OTHER): Payer: Medicare Other | Admitting: Physical Therapy

## 2021-07-24 ENCOUNTER — Encounter (HOSPITAL_BASED_OUTPATIENT_CLINIC_OR_DEPARTMENT_OTHER): Payer: Self-pay | Admitting: Physical Therapy

## 2021-07-24 DIAGNOSIS — M25611 Stiffness of right shoulder, not elsewhere classified: Secondary | ICD-10-CM

## 2021-07-24 DIAGNOSIS — M6281 Muscle weakness (generalized): Secondary | ICD-10-CM

## 2021-07-24 DIAGNOSIS — M25511 Pain in right shoulder: Secondary | ICD-10-CM

## 2021-07-24 NOTE — Therapy (Signed)
OUTPATIENT PHYSICAL THERAPY TREATMENT NOTE          Patient Name: Dwayne Wade MRN: [id number] DOB:01/12/1951, 71 y.o., male 45 Date: 07/24/2021  PCP: Alan Ripper, PA REFERRING MD:  Vanetta Mulders, MD  PT End of Session - 07/24/21 0805     Visit Number 23    Number of Visits 32    Date for PT Re-Evaluation 08/26/21    Authorization Type Medicare    Progress Note Due on Visit 30    PT Start Time 0804    PT Stop Time 8638    PT Time Calculation (min) 40 min    Activity Tolerance Patient tolerated treatment well    Behavior During Therapy [facility] for tasks assessed/performed                Past Medical History:  Diagnosis Date   Arthritis    Hypertension    Pre-diabetes    Past Surgical History:  Procedure Laterality Date   REVERSE SHOULDER ARTHROPLASTY Right 04/30/2021   Procedure: RIGHT REVERSE SHOULDER ARTHROPLASTY;  Surgeon: Vanetta Mulders, MD;  Location: [place];  Service: Orthopedics;  Laterality: Right;   TONSILLECTOMY  1990   There are no problems to display for this patient.    REFERRING DIAG:  M12.811 (ICD-10-CM) - Rotator cuff arthropathy of right shoulder  Surgery date 04/30/2021 Total reverse shoulder  THERAPY DIAG:  Decreased right shoulder range of motion  Acute pain of right shoulder  Muscle weakness (generalized)  PERTINENT HISTORY: Pre-diabetic, HTN   PRECAUTIONS: Shoulder- no R UE WB.  No UBE per protocol.  SUBJECTIVE:  Pt is 12 weeks and 1 day s/p R reverse total shoulder.   - Pt is not using sling except when in a large crowd such as church.  Pt's wife reports he was unable to lift his R UE for a long time prior to surgery and was just shrugging his R shoulder.    RESPONSE TO PRIOR RX:  Pt reports he felt it after prior Rx.  He reports increased pain to 2.5-30/10 after prior Rx.  He used tramadol which improved pain.  Pt reports he had some pain with home exercises including prone row, standing flexion AROM, and  shelf reaching.   FUNCTIONAL IMPROVEMENTS:  making bed, reaching to grab a water bottle, able to thread belt in his pants, shaving FUNCTIONAL LIMITATIONS:  lifting objects including grocery bags, sleeping, carrying objects, vacuuming/mopping, household cleaning/IADLs, reaching, and overhead activities.   PAIN:  Are you having pain? Yes NRPS:  2.5-3/10 current, Worst pain 3-4/10 Location: R shoulder.     OBJECTIVE:   TODAY'S TREATMENT:     Therapeutic Exercise: -Reviewed response to prior Rx, HEP compliance, reported functional progress and deficits, and pain level. -Pt performed: Prone row to neutral 2x10 reps S/L ER AROM 2x10 reps S/L shoulder Abd with head elevated w/n protocol range 2x10 reps Standing jobe's flexion AROM 2x10 reps Standing should flexion AROM x 10 reps in front of mirror Standing scaption AROM 2x10 reps in front of mirror Shelf reach x 10 reps Standing Functional IR AROM reaching toward hip gently Standing hz adduction AAROM with wand x 10 reps  -Pt received R shoulder PROM in flexion, scaption, abduction, ER, and IR in supine per protocol range and pt/tissue tolerance -See Pt education provided below.     PATIENT EDUCATION: Education details:  Educated pt in not performing exercises or range that causes pain to be > 2-3/10.  HEP.  Education provided concerning  exercise form.  Instructed pt in reducing shoulder compensatory hike with UE elevation Person educated: Patient and Spouse Education method: Explanation, Demonstration, Tactile cues, Verbal cues, Education comprehension: verbalized understanding, returned demonstration, verbal cues required, and tactile cues required     HOME EXERCISE PROGRAM: Access Code: [facility] URL: [email or web address] Date: 07/17/2021 Prepared by: Ronny Flurry  Exercises Seated Scapular Retraction - 3-5 x daily - 7 x weekly - 1 sets - 10 reps Circular Shoulder Pendulum with Table Support - 3-5 x daily -  7 x weekly - 1 sets - 20 reps Standing Elbow Flexion Extension AROM - 3-5 x daily - 7 x weekly - 1 sets - 20 reps Supine Shoulder External Rotation with Dowel at 20 Degrees of Abduction - 2 x daily - 7 x weekly - 1 sets - 15 reps - 5 hold Supine Shoulder Wand flexion AAROM - 2 x daily - 7 x weekly - 1-2 sets - 10 reps Supine Shoulder Flexion AROM - 2 x daily - 7 x weekly - 2 sets - 10 reps Isometric Shoulder Flexion at Wall - 1 x daily - 5 x weekly - 1-2 sets - 10 reps - 5 seconds hold Isometric Shoulder Abduction at Wall - 1 x daily - 5 x weekly - 1-2 sets - 10 reps - 5 seconds hold Isometric Shoulder Extension at Wall - 1 x daily - 7 x weekly - 1-2 sets - 10 reps - 5 seconds hold Single Arm Serratus Punches - 1 x daily - 6-7 x weekly - 2 sets - 10 reps Supine Shoulder Alphabet - 1-2 x daily - 7 x weekly - 1 reps Prone Shoulder Extension - Single Arm - 1 x daily - 5-6 x weekly - 2 sets - 10 reps Standing Shoulder Flexion Wall Walk - 2 x daily - 7 x weekly - 1 sets - 10 reps Standing Shoulder Flexion AAROM with Dowel - 2 x daily - 7 x weekly - 1 sets - 10 reps Sidelying Shoulder External Rotation - 1 x daily - 5-6 x weekly - 2 sets - 10 reps             ASSESSMENT:   CLINICAL IMPRESSION: Pt is making good progress in all areas and is progressing appropriately with protocol.  He and his wife are very pleased with his progress.  Pt has expected tightness and limitations with R shoulder ROM and continues to improve per protocol.  He is very motivated and gives great effort with all exercises.  PT progressed exercises per protocol and pt performed exercises per protocol well with cuing and instruction in correct form.  Pt reported some soreness with exercises.  Pt continues to have limitations with UE elevation though is improving.  He is limited with reaching behind back.  Pt continues to have expected limitations including IADLs, reaching, lifting, and overhead activities.  Pt responded well  to Rx having no increased pain after Rx.  He should cont to benefit from cont skilled PT services per protocol to address ongoing goals and to restore desired level of function.      Objective impairments include decreased ROM, decreased strength, hypomobility, increased edema, increased fascial restrictions, increased muscle spasms, impaired flexibility, impaired UE functional use, improper body mechanics, postural dysfunction, and pain. These impairments are limiting patient from cleaning, community activity, driving, meal prep, occupation, laundry, yard work, and shopping.            GOALS:     SHORT TERM GOALS:  STG Name Target Date Goal status  1 Pt will become independent with HEP in order to demonstrate synthesis of PT education.    05/16/2021 GOAL MET  2 Pt will score at least 23 pt increase on FOTO to demonstrate functional improvement in MCII and pt perceived function.     06/13/2021 GOAL MET  3 Pt will be able to demonstrate fwd flexion to 130 and ER to 30 in order to demonstrate functional improvement in UE ROM benchmark.    06/13/2021 75%  4 Pt will be able to demonstrate full symmetrical ROM in all planes in order to demonstrate functional improvement in UE function for progression to next phase of rehabilitation.     06/13/2021 INITIAL    LONG TERM GOALS:    LTG Name Target Date Goal status  1 Pt  will become independent with final HEP in order to demonstrate synthesis of PT education.     08/26/2021 INITIAL  2 Pt will score >/= 53 on FOTO to demonstrate functional improvement in R shoulder function for return to PLOF.     08/26/2021 99%  3 Pt will be able to reach [facility] and carry/hold >5 lbs in order to demonstrate functional improvement in R UE strength for return to PLOF and exercise.    08/26/2021 INITIAL  4 Pt will be able to demonstrate full AROM in all planes in order to demonstrate functional improvement in UE function for self-care and house hold duties.     08/26/2021 INITIAL    PLAN:   PLANNED INTERVENTIONS: Therapeutic exercises, Therapeutic activity, Neuro Muscular re-education, Patient/Family education, Joint mobilization, Aquatic Therapy, Dry Needling, Electrical stimulation, Spinal mobilization, Cryotherapy, Moist heat, scar mobilization, Splintting, Taping, Vasopneumatic device, Traction, Ultrasound, Ionotophoresis 75m/ml Dexamethasone, and Manual therapy   PLAN FOR NEXT SESSION: Cont with ROM and exercises per MD Reverse TSA protocol.     RSelinda MichaelsIII PT, DPT 07/24/21 6:14 PM

## 2021-07-25 ENCOUNTER — Ambulatory Visit (HOSPITAL_BASED_OUTPATIENT_CLINIC_OR_DEPARTMENT_OTHER)
Admission: RE | Admit: 2021-07-25 | Discharge: 2021-07-25 | Disposition: A | Discharge status: Home / Self Care | Payer: Medicare Other | Source: Ambulatory Visit | Attending: Orthopaedic Surgery | Admitting: Orthopaedic Surgery

## 2021-07-25 ENCOUNTER — Ambulatory Visit (INDEPENDENT_AMBULATORY_CARE_PROVIDER_SITE_OTHER): Payer: BC Managed Care – PPO | Admitting: Orthopaedic Surgery

## 2021-07-25 DIAGNOSIS — M12811 Other specific arthropathies, not elsewhere classified, right shoulder: Secondary | ICD-10-CM

## 2021-07-25 NOTE — Progress Notes (Signed)
Post Operative Evaluation    Procedure/Date of Surgery: Right reverse shoulder arthroplasty performed 05/01/2019  Interval History:   Dwayne Wade presents today 12 weeks status post the above the procedure. He is overall doing very well. He is now out of his recliner and back to his bed. He attempted to use a floor cleaner at his home but had fairly significant pain with this.   PMH/PSH/Family History/Social History/Meds/Allergies:    Past Medical History:  Diagnosis Date   Arthritis    Hypertension    Pre-diabetes    Past Surgical History:  Procedure Laterality Date   REVERSE SHOULDER ARTHROPLASTY Right 04/30/2021   Procedure: RIGHT REVERSE SHOULDER ARTHROPLASTY;  Surgeon: Huel Cote, MD;  Location: [facility];  Service: Orthopedics;  Laterality: Right;   TONSILLECTOMY  1990   Social History   Socioeconomic History   Marital status: Married    Spouse name: Not on file   Number of children: Not on file   Years of education: Not on file   Highest education level: Not on file  Occupational History   Not on file  Tobacco Use   Smoking status: Never   Smokeless tobacco: Never  Vaping Use   Vaping Use: Never used  Substance and Sexual Activity   Alcohol use: Not Currently   Drug use: Never   Sexual activity: Yes    Birth control/protection: None  Other Topics Concern   Not on file  Social History Narrative   Not on file   Social Determinants of Health   Financial Resource Strain: Not on file  Food Insecurity: Not on file  Transportation Needs: Not on file  Physical Activity: Not on file  Stress: Not on file  Social Connections: Not on file   No family history on file. Allergies  Allergen Reactions   Ibuprofen Nausea Only   Current Outpatient Medications  Medication Sig Dispense Refill   amLODipine (NORVASC) 5 MG tablet Take 5 mg by mouth in the morning.     aspirin EC 325 MG tablet Take 1 tablet (325 mg total) by mouth daily.  30 tablet 0   Cholecalciferol (VITAMIN D3) 50 MCG (2000 UT) TABS Take 2,000 Units by mouth every evening.     Dapagliflozin Propanediol (FARXIGA PO) Take by mouth.     DEXILANT 60 MG capsule Take 60 mg by mouth every evening.     hydrOXYzine (ATARAX/VISTARIL) 25 MG tablet Take 25 mg by mouth 4 (four) times daily as needed for anxiety.     losartan-hydrochlorothiazide (HYZAAR) 100-25 MG tablet Take 1 tablet by mouth every evening.     magnesium oxide (MAG-OX) 400 MG tablet Take 400 mg by mouth every evening.     meclizine (ANTIVERT) 25 MG tablet Take 25 mg by mouth every 8 (eight) hours as needed for dizziness.     montelukast (SINGULAIR) 10 MG tablet Take 10 mg by mouth daily as needed (allergies/respiratory issues).     Multiple Vitamin (MULTIVITAMIN WITH MINERALS) TABS tablet Take 1 tablet by mouth every evening.     oxycodone (OXY-IR) 5 MG capsule Take 1 capsule (5 mg total) by mouth every 4 (four) hours as needed (severe pain). 20 capsule 0   oxyCODONE (ROXICODONE) 15 MG immediate release tablet Take 15 mg by mouth every 4 (four) hours as needed for pain.  PROLATE 10-300 MG tablet Take 1 tablet by mouth in the morning, at noon, and at bedtime.     simvastatin (ZOCOR) 40 MG tablet Take 40 mg by mouth at bedtime.     traMADol (ULTRAM) 50 MG tablet Take 50 mg by mouth in the morning, at noon, in the evening, and at bedtime.     Vitamin E 450 MG (1000 UT) CAPS Take 1,000 Units by mouth every evening.     zolpidem (AMBIEN) 10 MG tablet Take 10 mg by mouth at bedtime.     Current Facility-Administered Medications  Medication Dose Route Frequency Provider Last Rate Last Admin   oxyCODONE (Oxy IR/ROXICODONE) immediate release tablet 5 mg  5 mg Oral Q4H PRN Huel Cote, MD       No results found.  Review of Systems:   A ROS was performed including pertinent positives and negatives as documented in the HPI.   Musculoskeletal Exam:    There were no vitals taken for this visit. Active  range of motion is 120 degrees forward elevation external rotation at the side is to 45 degrees.  Sensation is intact in all distributions of the right arm.  He is able to flex and extend at the right elbow  Imaging:    None  I personally reviewed and interpreted the radiographs.   Assessment:   71 year old male with right reverse shoulder arthroplasty 12 weeks postop overall doing.  He continues to make progress but is limited in terms of doing exercises like floor cleaning. I would like him to work on regaining this additional strength prior return to work.   Plan :    -Return to clinic in 8 weeks -Pt updated for neck and trapezial tightness   I personally saw and evaluated the patient, and participated in the management and treatment plan.  Huel Cote, MD Attending Physician, Orthopedic Surgery  This document was dictated using Dragon voice recognition software. A reasonable attempt at proof reading has been made to minimize errors.

## 2021-07-25 NOTE — Addendum Note (Signed)
Addended by: Benancio Deeds on: 07/25/2021 08:29 AM   Modules accepted: Orders

## 2021-07-29 ENCOUNTER — Ambulatory Visit (HOSPITAL_BASED_OUTPATIENT_CLINIC_OR_DEPARTMENT_OTHER): Payer: Medicare Other | Admitting: Physical Therapy

## 2021-07-31 ENCOUNTER — Other Ambulatory Visit: Payer: Self-pay

## 2021-07-31 ENCOUNTER — Encounter (HOSPITAL_BASED_OUTPATIENT_CLINIC_OR_DEPARTMENT_OTHER): Payer: Self-pay | Admitting: Physical Therapy

## 2021-07-31 ENCOUNTER — Ambulatory Visit (HOSPITAL_BASED_OUTPATIENT_CLINIC_OR_DEPARTMENT_OTHER): Payer: Medicare Other | Admitting: Physical Therapy

## 2021-07-31 DIAGNOSIS — M25611 Stiffness of right shoulder, not elsewhere classified: Secondary | ICD-10-CM | POA: Diagnosis not present

## 2021-07-31 DIAGNOSIS — M25511 Pain in right shoulder: Secondary | ICD-10-CM

## 2021-07-31 DIAGNOSIS — M6281 Muscle weakness (generalized): Secondary | ICD-10-CM

## 2021-07-31 NOTE — Therapy (Signed)
OUTPATIENT PHYSICAL THERAPY TREATMENT NOTE          Patient Name: Dwayne Wade MRN: [id number] DOB:[birth year], 71 y.o., male 62 Date: 07/31/2021  PCP: Alan Ripper, PA REFERRING MD:  Vanetta Mulders, MD  PT End of Session - 07/31/21 0853     Visit Number 24    Number of Visits 32    Date for PT Re-Evaluation 08/26/21    Authorization Type Medicare    Progress Note Due on Visit 30    PT Start Time 0806    PT Stop Time 0848    PT Time Calculation (min) 42 min    Activity Tolerance Patient tolerated treatment well    Behavior During Therapy [facility] for tasks assessed/performed                 Past Medical History:  Diagnosis Date   Arthritis    Hypertension    Pre-diabetes    Past Surgical History:  Procedure Laterality Date   REVERSE SHOULDER ARTHROPLASTY Right 04/30/2021   Procedure: RIGHT REVERSE SHOULDER ARTHROPLASTY;  Surgeon: Vanetta Mulders, MD;  Location: [place];  Service: Orthopedics;  Laterality: Right;   TONSILLECTOMY  1990   There are no problems to display for this patient.    REFERRING DIAG:  M12.811 (ICD-10-CM) - Rotator cuff arthropathy of right shoulder  Surgery date 04/30/2021 Total reverse shoulder  THERAPY DIAG:  Decreased right shoulder range of motion  Acute pain of right shoulder  Muscle weakness (generalized)  PERTINENT HISTORY: Pre-diabetic, HTN   PRECAUTIONS: Shoulder- no R UE WB.  No UBE per protocol.  SUBJECTIVE:  Pt is 13 weeks and 1 day s/p R reverse total shoulder.    Pt saw MD last Friday who was very pleased with progress.  Pt informed MD about his cervical pain.  Pt's wife states that MD ordered pt a brace and informed them he would have PT work on his neck.  Pt received the brace and has been using the cervical brace when sitting.  He reports having increased Monday night after using the brace and feels that his cervical mm tensed up.  Pt and wife state he may have been using it wrong.  He had placed  it around his head and neck.  PT educated pt with just using it around his cervical.  His neck is feeling better now.   -Pt is not using sling except when in a large crowd such as church.  Pt's wife reports he was unable to lift his R UE for a long time prior to surgery and was just shrugging his R shoulder.    RESPONSE TO PRIOR RX:  Pt reports having a 2.5 almost a 3/10 pain after prior Rx.   FUNCTIONAL IMPROVEMENTS:  making bed, reaching to grab a water bottle, able to thread belt in his pants, shaving FUNCTIONAL LIMITATIONS:  lifting objects including grocery bags, sleeping, carrying objects, vacuuming/mopping, household cleaning/IADLs, reaching, and overhead activities.   PAIN:  Are you having pain? Yes NRPS:  2/10 current, Worst pain 3-4/10 Location: R shoulder.  Pt states he is having very little pain in his neck currently.     OBJECTIVE:   TODAY'S TREATMENT:   Pt has tenderness in bilat cervical paraspinals > UT.     Therapeutic Exercise: -Reviewed response to prior Rx, HEP compliance, reported functional progress and deficits, and pain level. -Pt performed: Supine serratus punch with YTB X 10 reps and RTB 2x10 reps Supine shoulder ABC with 1# Supine  rhythmic stab's at 90 deg flexion 3x30 sec Scap retraction with YTB 2x10 Standing extension with YTB with retraction 2x10 reps Standing Functional IR AROM reaching toward hip gently Standing hz adduction AAROM with wand 2 x 10 reps  -Pt received R shoulder PROM in flexion, scaption, abduction, ER, and IR in supine per protocol range and pt/tissue tolerance -See Pt education provided below.     PATIENT EDUCATION: Education details:  Educated pt in not performing exercises or range that causes pain to be > 2-3/10.  HEP.  Education provided concerning exercise form.  Instructed pt in reducing shoulder compensatory hike with UE elevation. Answered Pt's and wife's questions. Person educated: Patient and Spouse Education  method: Explanation, Demonstration, Tactile cues, Verbal cues, Education comprehension: verbalized understanding, returned demonstration, verbal cues required, and tactile cues required     HOME EXERCISE PROGRAM: Access Code: [facility] URL: [email or web address] Date: 07/17/2021 Prepared by: Ronny Flurry  Exercises Seated Scapular Retraction - 3-5 x daily - 7 x weekly - 1 sets - 10 reps Circular Shoulder Pendulum with Table Support - 3-5 x daily - 7 x weekly - 1 sets - 20 reps Standing Elbow Flexion Extension AROM - 3-5 x daily - 7 x weekly - 1 sets - 20 reps Supine Shoulder External Rotation with Dowel at 20 Degrees of Abduction - 2 x daily - 7 x weekly - 1 sets - 15 reps - 5 hold Supine Shoulder Wand flexion AAROM - 2 x daily - 7 x weekly - 1-2 sets - 10 reps Supine Shoulder Flexion AROM - 2 x daily - 7 x weekly - 2 sets - 10 reps Isometric Shoulder Flexion at Wall - 1 x daily - 5 x weekly - 1-2 sets - 10 reps - 5 seconds hold Isometric Shoulder Abduction at Wall - 1 x daily - 5 x weekly - 1-2 sets - 10 reps - 5 seconds hold Isometric Shoulder Extension at Wall - 1 x daily - 7 x weekly - 1-2 sets - 10 reps - 5 seconds hold Single Arm Serratus Punches - 1 x daily - 6-7 x weekly - 2 sets - 10 reps Supine Shoulder Alphabet - 1-2 x daily - 7 x weekly - 1 reps Prone Shoulder Extension - Single Arm - 1 x daily - 5-6 x weekly - 2 sets - 10 reps Standing Shoulder Flexion Wall Walk - 2 x daily - 7 x weekly - 1 sets - 10 reps Standing Shoulder Flexion AAROM with Dowel - 2 x daily - 7 x weekly - 1 sets - 10 reps Sidelying Shoulder External Rotation - 1 x daily - 5-6 x weekly - 2 sets - 10 reps             ASSESSMENT:   CLINICAL IMPRESSION: Pt has been having some cervical pain and MD added neck and UT tightness to PT script.  Pt is making good progress in all areas and is progressing appropriately with protocol.  He and his wife are very pleased with his progress.  PT  progressed to the next phase of protocol with adding light resistance to certain exercises.  Pt performed exercises well with cuing and instruction.  He continues to improve with ROM and UE elevation.  Pt does have tenderness in cervcial paraspinals/UT.  Pt responded well to Rx and should cont to benefit from cont skilled PT services per protocol to address ongoing goals and to restore desired level of function.      Objective impairments include  decreased ROM, decreased strength, hypomobility, increased edema, increased fascial restrictions, increased muscle spasms, impaired flexibility, impaired UE functional use, improper body mechanics, postural dysfunction, and pain. These impairments are limiting patient from cleaning, community activity, driving, meal prep, occupation, laundry, yard work, and shopping.            GOALS:     SHORT TERM GOALS:   STG Name Target Date Goal status  1 Pt will become independent with HEP in order to demonstrate synthesis of PT education.    05/16/2021 GOAL MET  2 Pt will score at least 23 pt increase on FOTO to demonstrate functional improvement in MCII and pt perceived function.     06/13/2021 GOAL MET  3 Pt will be able to demonstrate fwd flexion to 130 and ER to 30 in order to demonstrate functional improvement in UE ROM benchmark.    06/13/2021 75%  4 Pt will be able to demonstrate full symmetrical ROM in all planes in order to demonstrate functional improvement in UE function for progression to next phase of rehabilitation.     06/13/2021 INITIAL    LONG TERM GOALS:    LTG Name Target Date Goal status  1 Pt  will become independent with final HEP in order to demonstrate synthesis of PT education.     08/26/2021 INITIAL  2 Pt will score >/= 53 on FOTO to demonstrate functional improvement in R shoulder function for return to PLOF.     08/26/2021 99%  3 Pt will be able to reach [facility] and carry/hold >5 lbs in order to demonstrate functional improvement in R  UE strength for return to PLOF and exercise.    08/26/2021 INITIAL  4 Pt will be able to demonstrate full AROM in all planes in order to demonstrate functional improvement in UE function for self-care and house hold duties.    08/26/2021 INITIAL    PLAN:   PLANNED INTERVENTIONS: Therapeutic exercises, Therapeutic activity, Neuro Muscular re-education, Patient/Family education, Joint mobilization, Aquatic Therapy, Dry Needling, Electrical stimulation, Spinal mobilization, Cryotherapy, Moist heat, scar mobilization, Splintting, Taping, Vasopneumatic device, Traction, Ultrasound, Ionotophoresis 24m/ml Dexamethasone, and Manual therapy   PLAN FOR NEXT SESSION: Cont with ROM and exercises per MD Reverse TSA protocol.  MD included for PT to address neck and trapezius tightness and strengthening on PT script.     RSelinda MichaelsIII PT, DPT 07/31/21 8:50 PM

## 2021-08-05 ENCOUNTER — Other Ambulatory Visit: Payer: Self-pay

## 2021-08-05 ENCOUNTER — Ambulatory Visit (HOSPITAL_BASED_OUTPATIENT_CLINIC_OR_DEPARTMENT_OTHER): Payer: Medicare Other | Admitting: Physical Therapy

## 2021-08-05 ENCOUNTER — Telehealth: Payer: Self-pay | Admitting: Orthopaedic Surgery

## 2021-08-05 ENCOUNTER — Encounter (HOSPITAL_BASED_OUTPATIENT_CLINIC_OR_DEPARTMENT_OTHER): Payer: Self-pay | Admitting: Physical Therapy

## 2021-08-05 DIAGNOSIS — M25511 Pain in right shoulder: Secondary | ICD-10-CM

## 2021-08-05 DIAGNOSIS — M25611 Stiffness of right shoulder, not elsewhere classified: Secondary | ICD-10-CM | POA: Diagnosis not present

## 2021-08-05 DIAGNOSIS — M6281 Muscle weakness (generalized): Secondary | ICD-10-CM

## 2021-08-05 NOTE — Telephone Encounter (Signed)
Received medical records release form,$25.00 cash and Disability paperwork from patient    Forwarding to [facility] today

## 2021-08-05 NOTE — Therapy (Signed)
OUTPATIENT PHYSICAL THERAPY TREATMENT NOTE          Patient Name: Dwayne Wade MRN: [id number] DOB:12/10/1950, 71 y.o., male 39 Date: 08/05/2021  PCP: Alan Ripper, PA REFERRING MD:  Vanetta Mulders, MD  PT End of Session - 08/05/21 [phone]     Visit Number 25    Number of Visits 32    Date for PT Re-Evaluation 08/26/21    Authorization Type Medicare    Progress Note Due on Visit 30    PT Start Time 0801    PT Stop Time 0846    PT Time Calculation (min) 45 min    Activity Tolerance Patient tolerated treatment well    Behavior During Therapy [facility] for tasks assessed/performed                  Past Medical History:  Diagnosis Date   Arthritis    Hypertension    Pre-diabetes    Past Surgical History:  Procedure Laterality Date   REVERSE SHOULDER ARTHROPLASTY Right 04/30/2021   Procedure: RIGHT REVERSE SHOULDER ARTHROPLASTY;  Surgeon: Vanetta Mulders, MD;  Location: [place];  Service: Orthopedics;  Laterality: Right;   TONSILLECTOMY  1990   There are no problems to display for this patient.    REFERRING DIAG:  M12.811 (ICD-10-CM) - Rotator cuff arthropathy of right shoulder  Surgery date 04/30/2021 Total reverse shoulder  THERAPY DIAG:  Decreased right shoulder range of motion  Acute pain of right shoulder  Muscle weakness (generalized)  PERTINENT HISTORY: Pre-diabetic, HTN   PRECAUTIONS: Shoulder- no R UE WB.  No UBE per protocol.  SUBJECTIVE:  Pt is 13 weeks and 6 days s/p R reverse total shoulder.   -Pt is not using sling except when in a large crowd such as church. -Pt's wife reports he was unable to lift his R UE for a long time prior to surgery and was just shrugging his R shoulder.   -Pt reports compliance with HEP.  Pt states he typically has 2/10 pain in R shoulder which increases to 2.5/10 after HEP.  Pt states he had some pain at home with performing Functional IR AROM behind back. RESPONSE TO PRIOR RX:  Pt reports having a  2.5-3/10 pain after prior Rx which lasted a little while.  He states he did have some pain the following day.   FUNCTIONAL IMPROVEMENTS:  making bed, reaching to grab a water bottle, able to thread belt in his pants, shaving FUNCTIONAL LIMITATIONS:  lifting objects including grocery bags, sleeping, carrying objects, vacuuming/mopping, household cleaning/IADLs, reaching, and overhead activities.   PAIN:  Are you having pain? Yes NRPS:  2/10 current, Worst pain 3-4/10 Location: R shoulder.  Pt states he is having very little pain in his neck currently.     OBJECTIVE:   TODAY'S TREATMENT:     Therapeutic Exercise: -Reviewed response to prior Rx, HEP compliance, reported functional progress and deficits, and pain level. -Pt performed: Supine serratus punch with YTB X 10 reps and RTB 2x10 reps Supine shoulder ABC with 1# S/L ER 2x12 reps Supine rhythmic stab's at 90 AND 60 deg flexion 3x30 sec Standing ER with arm at side with YTB 2x10 reps Rows with scap retraction with RTB 2x10 Standing extension with YTB with retraction 2x10 reps Standing hz adduction AAROM with wand 2 x 10 reps  -Pt received R shoulder PROM in flexion, scaption, abduction, ER, and IR in supine per protocol range and pt/tissue tolerance -See Pt education provided below.  PATIENT EDUCATION: Education details:  Updated HEP with resisted supine serratus punch, ABC with 1#, resisted rows and shoulder ext with retraction.  Gave pt a HEP handout.  Educated pt in correct form and appropriate frequency and instructed pt he should not have increased pain with HEP.  Correct exercise form. Person educated: Patient and Spouse Education method: Explanation, Demonstration, Tactile cues, Verbal cues, Education comprehension: verbalized understanding, returned demonstration, verbal cues required, and tactile cues required     HOME EXERCISE PROGRAM: Access Code: [facility] URL: [email or web address] Date:  07/17/2021 Prepared by: Ronny Flurry  Exercises Seated Scapular Retraction - 3-5 x daily - 7 x weekly - 1 sets - 10 reps Circular Shoulder Pendulum with Table Support - 3-5 x daily - 7 x weekly - 1 sets - 20 reps Standing Elbow Flexion Extension AROM - 3-5 x daily - 7 x weekly - 1 sets - 20 reps Supine Shoulder External Rotation with Dowel at 20 Degrees of Abduction - 2 x daily - 7 x weekly - 1 sets - 15 reps - 5 hold Supine Shoulder Wand flexion AAROM - 2 x daily - 7 x weekly - 1-2 sets - 10 reps Supine Shoulder Flexion AROM - 2 x daily - 7 x weekly - 2 sets - 10 reps Isometric Shoulder Flexion at Wall - 1 x daily - 5 x weekly - 1-2 sets - 10 reps - 5 seconds hold Isometric Shoulder Abduction at Wall - 1 x daily - 5 x weekly - 1-2 sets - 10 reps - 5 seconds hold Isometric Shoulder Extension at Wall - 1 x daily - 7 x weekly - 1-2 sets - 10 reps - 5 seconds hold Single Arm Serratus Punches - 1 x daily - 3-4 x weekly - 2 sets - 10 reps  with YTB Supine Shoulder Alphabet - 1x daily - 4 x weekly - 1 reps  with 1# Prone Shoulder Extension - Single Arm - 1 x daily - 5-6 x weekly - 2 sets - 10 reps Standing Shoulder Flexion Wall Walk - 2 x daily - 7 x weekly - 1 sets - 10 reps Standing Shoulder Flexion AAROM with Dowel - 2 x daily - 7 x weekly - 1 sets - 10 reps Sidelying Shoulder External Rotation - 1 x daily - 5-6 x weekly - 2 sets - 10 reps Rows with scapular retraction with resistance 3x/wk, 2- 3 sets of 10 reps Shoulder extension with retraction with resistance 3x/wk, 2-3 sets of 10 reps             ASSESSMENT:   CLINICAL IMPRESSION: Pt is making good progress in all areas and is progressing appropriately with protocol.  PT progressed exercises with light resistance per protocol and pt performed well with cuing and instruction in correct form.  Pt demonstrates good tolerance with lightly resisted exercises.  Pt tolerated PROM well and has tightness at end ranges.  He and his wife are  very pleased with his progress.   Pt responded well to Rx reporting no change in pain after Rx.  He should cont to benefit from cont skilled PT services per protocol to address ongoing goals and to restore desired level of function.     Objective impairments include decreased ROM, decreased strength, hypomobility, increased edema, increased fascial restrictions, increased muscle spasms, impaired flexibility, impaired UE functional use, improper body mechanics, postural dysfunction, and pain. These impairments are limiting patient from cleaning, community activity, driving, meal prep, occupation, laundry, yard work, and shopping.  GOALS:     SHORT TERM GOALS:   STG Name Target Date Goal status  1 Pt will become independent with HEP in order to demonstrate synthesis of PT education.    05/16/2021 GOAL MET  2 Pt will score at least 23 pt increase on FOTO to demonstrate functional improvement in MCII and pt perceived function.     06/13/2021 GOAL MET  3 Pt will be able to demonstrate fwd flexion to 130 and ER to 30 in order to demonstrate functional improvement in UE ROM benchmark.    06/13/2021 75%  4 Pt will be able to demonstrate full symmetrical ROM in all planes in order to demonstrate functional improvement in UE function for progression to next phase of rehabilitation.     06/13/2021 INITIAL    LONG TERM GOALS:    LTG Name Target Date Goal status  1 Pt  will become independent with final HEP in order to demonstrate synthesis of PT education.     08/26/2021 INITIAL  2 Pt will score >/= 53 on FOTO to demonstrate functional improvement in R shoulder function for return to PLOF.     08/26/2021 99%  3 Pt will be able to reach [facility] and carry/hold >5 lbs in order to demonstrate functional improvement in R UE strength for return to PLOF and exercise.    08/26/2021 INITIAL  4 Pt will be able to demonstrate full AROM in all planes in order to demonstrate functional improvement in UE  function for self-care and house hold duties.    08/26/2021 INITIAL    PLAN:   PLANNED INTERVENTIONS: Therapeutic exercises, Therapeutic activity, Neuro Muscular re-education, Patient/Family education, Joint mobilization, Aquatic Therapy, Dry Needling, Electrical stimulation, Spinal mobilization, Cryotherapy, Moist heat, scar mobilization, Splintting, Taping, Vasopneumatic device, Traction, Ultrasound, Ionotophoresis 71m/ml Dexamethasone, and Manual therapy   PLAN FOR NEXT SESSION: Cont with ROM and strengthening per MD Reverse TSA protocol.  MD included for PT to address neck and trapezius tightness and strengthening on PT script.  Check UT tightness and possible STW to UT.     RSelinda MichaelsIII PT, DPT 08/05/21 4:11 PM

## 2021-08-07 ENCOUNTER — Encounter (HOSPITAL_BASED_OUTPATIENT_CLINIC_OR_DEPARTMENT_OTHER): Payer: Self-pay | Admitting: Physical Therapy

## 2021-08-07 ENCOUNTER — Other Ambulatory Visit: Payer: Self-pay

## 2021-08-07 ENCOUNTER — Ambulatory Visit (HOSPITAL_BASED_OUTPATIENT_CLINIC_OR_DEPARTMENT_OTHER): Payer: Medicare Other | Admitting: Physical Therapy

## 2021-08-07 DIAGNOSIS — M6281 Muscle weakness (generalized): Secondary | ICD-10-CM

## 2021-08-07 DIAGNOSIS — M25611 Stiffness of right shoulder, not elsewhere classified: Secondary | ICD-10-CM | POA: Diagnosis not present

## 2021-08-07 DIAGNOSIS — M25511 Pain in right shoulder: Secondary | ICD-10-CM

## 2021-08-07 NOTE — Therapy (Signed)
OUTPATIENT PHYSICAL THERAPY TREATMENT NOTE          Patient Name: Dwayne Wade MRN: [id number] DOB:03-24-1951, 71 y.o., male 51 Date: 08/07/2021  PCP: Alan Ripper, PA REFERRING MD:  Vanetta Mulders, MD  PT End of Session - 08/07/21 0831     Visit Number 26    Number of Visits 32    Date for PT Re-Evaluation 08/26/21    Authorization Type Medicare    Progress Note Due on Visit 43    PT Start Time 0805    PT Stop Time 0848    PT Time Calculation (min) 43 min    Activity Tolerance Patient tolerated treatment well    Behavior During Therapy [facility] for tasks assessed/performed                   Past Medical History:  Diagnosis Date   Arthritis    Hypertension    Pre-diabetes    Past Surgical History:  Procedure Laterality Date   REVERSE SHOULDER ARTHROPLASTY Right 04/30/2021   Procedure: RIGHT REVERSE SHOULDER ARTHROPLASTY;  Surgeon: Vanetta Mulders, MD;  Location: [place];  Service: Orthopedics;  Laterality: Right;   TONSILLECTOMY  1990   There are no problems to display for this patient.    REFERRING DIAG:  M12.811 (ICD-10-CM) - Rotator cuff arthropathy of right shoulder  Surgery date 04/30/2021 Total reverse shoulder  THERAPY DIAG:  Decreased right shoulder range of motion  Acute pain of right shoulder  Muscle weakness (generalized)  PERTINENT HISTORY: Pre-diabetic, HTN   PRECAUTIONS: Shoulder- no R UE WB.  No UBE per protocol.  SUBJECTIVE:  Pt is 14 weeks and 1 day s/p R reverse total shoulder.   -Pt states his neck was hurting last night.  Pt's wife massaged his neck which helped.  Pt states he used the cervical brace/collar from [facility].  -Pt reports compliance with HEP.  Pt states he typically has 2/10 pain in R shoulder which increases to 2.5/10 after HEP.  Pt and wife state he had some pain at home with performing wand horiz add behind back. RESPONSE TO PRIOR RX:  Pt reports no increased pain, 2/10, after prior Rx.  FUNCTIONAL  IMPROVEMENTS:  making bed, reaching to grab a water bottle, able to thread belt in his pants, shaving FUNCTIONAL LIMITATIONS:  lifting objects including grocery bags, sleeping, carrying objects, vacuuming/mopping, household cleaning/IADLs, reaching, and overhead activities.   PAIN:  Are you having pain? Yes NRPS:  2/10 / 1/10 current Location: R shoulder / cervical   OBJECTIVE:   TODAY'S TREATMENT:     Therapeutic Exercise: -Reviewed response to prior Rx, HEP compliance, reported functional progress and deficits, and pain level. -Pt performed: Supine serratus punch with RTB 2x10 reps Supine shoulder ABC with 1# Supine rhythmic stab's at 90 AND 60 deg flexion 3x30 sec Prone extension with 1# 2x10 reps Prone row with 1# 2x10 reps Standing ER with arm at side with YTB 2x10 reps Standing Jobe's flexion with 1# 2x10 reps Reviewed form with Standing hz adduction AAROM with wand Standing functional IR 2 x 10 reps AROM Standing wand IR x 10 reps   -Pt received R shoulder PROM in flexion, scaption, abduction, ER, and IR in supine per protocol range and pt/tissue tolerance -See Pt education provided below.     PATIENT EDUCATION: Education details:  Reviewed HEP.  Instructed pt he should not have increased pain with HEP.  Instructed pt in performing standing functional IR AROM instead of wand hz  add behind back.  Instructed pt to be gentle with behind back motions and he should not perform AAROM or AROM behind back into tightness or pain.  Correct exercise form.  Answered pt's wife questions and demonstrated gentle STW to UT. Person educated: Patient and Spouse Education method: Explanation, Demonstration, Tactile cues, Verbal cues, Education comprehension: verbalized understanding, returned demonstration, verbal cues required, and tactile cues required     HOME EXERCISE PROGRAM: Access Code: [facility] URL: [email or web address] Date: 07/17/2021 Prepared by: Ronny Flurry  Exercises Seated Scapular Retraction - 3-5 x daily - 7 x weekly - 1 sets - 10 reps Circular Shoulder Pendulum with Table Support - 3-5 x daily - 7 x weekly - 1 sets - 20 reps Standing Elbow Flexion Extension AROM - 3-5 x daily - 7 x weekly - 1 sets - 20 reps Supine Shoulder External Rotation with Dowel at 20 Degrees of Abduction - 2 x daily - 7 x weekly - 1 sets - 15 reps - 5 hold Supine Shoulder Wand flexion AAROM - 2 x daily - 7 x weekly - 1-2 sets - 10 reps Supine Shoulder Flexion AROM - 2 x daily - 7 x weekly - 2 sets - 10 reps Isometric Shoulder Flexion at Wall - 1 x daily - 5 x weekly - 1-2 sets - 10 reps - 5 seconds hold Isometric Shoulder Abduction at Wall - 1 x daily - 5 x weekly - 1-2 sets - 10 reps - 5 seconds hold Isometric Shoulder Extension at Wall - 1 x daily - 7 x weekly - 1-2 sets - 10 reps - 5 seconds hold Single Arm Serratus Punches - 1 x daily - 3-4 x weekly - 2 sets - 10 reps  with YTB Supine Shoulder Alphabet - 1x daily - 4 x weekly - 1 reps  with 1# Prone Shoulder Extension - Single Arm - 1 x daily - 5-6 x weekly - 2 sets - 10 reps Standing Shoulder Flexion Wall Walk - 2 x daily - 7 x weekly - 1 sets - 10 reps Standing Shoulder Flexion AAROM with Dowel - 2 x daily - 7 x weekly - 1 sets - 10 reps Sidelying Shoulder External Rotation - 1 x daily - 5-6 x weekly - 2 sets - 10 reps Rows with scapular retraction with resistance 3x/wk, 2- 3 sets of 10 reps Shoulder extension with retraction with resistance 3x/wk, 2-3 sets of 10 reps         ASSESSMENT:   CLINICAL IMPRESSION: Pt is making good progress in all areas and is progressing appropriately with protocol.  PT is progressing intensity of exercises with adding light resistance per protocol.  Pt performed exercises well with cuing and instruction in correct form.  Pt had pain with wand hz add behind  back in standing at home.  PT instructed pt in correct form and appropriate ROM.  PT educated pt in standing  functional IR AROM including correct form and pt performed in the clinic.  PT instructed pt to perform functional IR AROM at home instead of wand hz add.  Pt tolerated PROM well and has tightness at end ranges.  He and his wife are very pleased with his progress.   Pt is having some cervical pain and reports feeling better when his wife massaged it.  He is wearing a cervical brace/collar from [facility] some.  Pt responded well to Rx reporting 1.5-2/10 pain after Rx.  He should cont to benefit from cont skilled PT  services per protocol to address ongoing goals and to restore desired level of function.      Objective impairments include decreased ROM, decreased strength, hypomobility, increased edema, increased fascial restrictions, increased muscle spasms, impaired flexibility, impaired UE functional use, improper body mechanics, postural dysfunction, and pain. These impairments are limiting patient from cleaning, community activity, driving, meal prep, occupation, laundry, yard work, and shopping.            GOALS:     SHORT TERM GOALS:   STG Name Target Date Goal status  1 Pt will become independent with HEP in order to demonstrate synthesis of PT education.    05/16/2021 GOAL MET  2 Pt will score at least 23 pt increase on FOTO to demonstrate functional improvement in MCII and pt perceived function.     06/13/2021 GOAL MET  3 Pt will be able to demonstrate fwd flexion to 130 and ER to 30 in order to demonstrate functional improvement in UE ROM benchmark.    06/13/2021 75%  4 Pt will be able to demonstrate full symmetrical ROM in all planes in order to demonstrate functional improvement in UE function for progression to next phase of rehabilitation.     06/13/2021 INITIAL    LONG TERM GOALS:    LTG Name Target Date Goal status  1 Pt  will become independent with final HEP in order to demonstrate synthesis of PT education.     08/26/2021 INITIAL  2 Pt will score >/= 53 on FOTO to demonstrate  functional improvement in R shoulder function for return to PLOF.     08/26/2021 99%  3 Pt will be able to reach [facility] and carry/hold >5 lbs in order to demonstrate functional improvement in R UE strength for return to PLOF and exercise.    08/26/2021 INITIAL  4 Pt will be able to demonstrate full AROM in all planes in order to demonstrate functional improvement in UE function for self-care and house hold duties.    08/26/2021 INITIAL    PLAN:   PLANNED INTERVENTIONS: Therapeutic exercises, Therapeutic activity, Neuro Muscular re-education, Patient/Family education, Joint mobilization, Aquatic Therapy, Dry Needling, Electrical stimulation, Spinal mobilization, Cryotherapy, Moist heat, scar mobilization, Splintting, Taping, Vasopneumatic device, Traction, Ultrasound, Ionotophoresis 7m/ml Dexamethasone, and Manual therapy   PLAN FOR NEXT SESSION: Cont with ROM and strengthening per MD Reverse TSA protocol.  MD included for PT to address neck and trapezius tightness and strengthening on PT script.  Check UT tightness and possible STW to UT.   RSelinda MichaelsIII PT, DPT 08/07/21 4:22 PM

## 2021-08-12 ENCOUNTER — Ambulatory Visit (HOSPITAL_BASED_OUTPATIENT_CLINIC_OR_DEPARTMENT_OTHER): Payer: Medicare Other | Admitting: Physical Therapy

## 2021-08-14 ENCOUNTER — Ambulatory Visit (HOSPITAL_BASED_OUTPATIENT_CLINIC_OR_DEPARTMENT_OTHER): Payer: Medicare Other | Attending: Orthopaedic Surgery | Admitting: Physical Therapy

## 2021-08-14 ENCOUNTER — Other Ambulatory Visit: Payer: Self-pay

## 2021-08-14 ENCOUNTER — Encounter (HOSPITAL_BASED_OUTPATIENT_CLINIC_OR_DEPARTMENT_OTHER): Payer: Self-pay | Admitting: Physical Therapy

## 2021-08-14 DIAGNOSIS — M6281 Muscle weakness (generalized): Secondary | ICD-10-CM | POA: Insufficient documentation

## 2021-08-14 DIAGNOSIS — M25511 Pain in right shoulder: Secondary | ICD-10-CM | POA: Insufficient documentation

## 2021-08-14 DIAGNOSIS — M25611 Stiffness of right shoulder, not elsewhere classified: Secondary | ICD-10-CM | POA: Insufficient documentation

## 2021-08-14 NOTE — Therapy (Signed)
OUTPATIENT PHYSICAL THERAPY TREATMENT NOTE          Patient Name: Dwayne Wade MRN: [id number] DOB:Mar 10, 1951, 71 y.o., male 64 Date: 08/14/2021  PCP: Alan Ripper, PA REFERRING MD:  Vanetta Mulders, MD  PT End of Session - 08/14/21 [phone]     Visit Number 27    Number of Visits 32    Date for PT Re-Evaluation 08/26/21    Authorization Type Medicare    Progress Note Due on Visit 78    PT Start Time 0807    PT Stop Time 0847    PT Time Calculation (min) 40 min    Activity Tolerance Patient tolerated treatment well    Behavior During Therapy [facility] for tasks assessed/performed                    Past Medical History:  Diagnosis Date   Arthritis    Hypertension    Pre-diabetes    Past Surgical History:  Procedure Laterality Date   REVERSE SHOULDER ARTHROPLASTY Right 04/30/2021   Procedure: RIGHT REVERSE SHOULDER ARTHROPLASTY;  Surgeon: Vanetta Mulders, MD;  Location: [place];  Service: Orthopedics;  Laterality: Right;   TONSILLECTOMY  1990   There are no problems to display for this patient.    REFERRING DIAG:  M12.811 (ICD-10-CM) - Rotator cuff arthropathy of right shoulder  Surgery date 04/30/2021 Total reverse shoulder  THERAPY DIAG:  Decreased right shoulder range of motion  Acute pain of right shoulder  Muscle weakness (generalized)  PERTINENT HISTORY: Pre-diabetic, HTN   PRECAUTIONS: Shoulder- no R UE WB.  No UBE per protocol.  SUBJECTIVE:  Pt is 15 weeks and 1 day s/p R reverse total shoulder.    -Pt reports compliance with HEP.  Pt states he typically has 2/10 pain in R shoulder which increases to 2.5-3/10 after HEP.  He states he had some pain in R shoulder just sitting down and resting the other night.  Pt states he has difficulty with reaching behind back and had pain with performing AAROM behind his back.  Pt states his neck feels better and his wife massages it.  Pt does use the cervical brace he ordered from [employer].    RESPONSE TO PRIOR RX:  Pt reports a little increased pain after prior Rx.  He reports 2/10 R shoulder pain after prior Rx.  FUNCTIONAL IMPROVEMENTS:  making bed, reaching to grab a water bottle, able to thread belt in his pants, shaving FUNCTIONAL LIMITATIONS:  pt had some shoulder pain when cleaning the toilet.  lifting objects including grocery bags, sleeping, carrying objects, vacuuming/mopping, household cleaning/IADLs, reaching, and overhead activities.   PAIN:  Are you having pain? Yes NRPS:  2/10  Location: R shoulder  Pt denies cervical pain, just tense.     OBJECTIVE:   TODAY'S TREATMENT:     Therapeutic Exercise: -Reviewed response to prior Rx, HEP compliance, reported functional progress and deficits, and pain level. -Pt performed: Standing ER with arm at side with YTB 3x10 reps Standing Jobe's flexion with 1# 2x10 reps Standing functional IR x 10 reps AROM Standing wand IR 2  x 10 reps Standing scaption 2x10 reps   -Pt received R shoulder PROM in flexion, scaption, abduction, ER, and IR in supine per protocol range and pt/tissue tolerance -See Pt education provided below.  Manual Therapy:  Pt received STM to bilat cervical paraspinals and bilat UT in supine to improve soft tissue tightness and pain.     PATIENT EDUCATION: Education  details:  Reviewed HEP.  Instructed pt he should not have increased pain with HEP.  Instructed pt in performing standing functional IR AROM instead of wand hz add behind back.  Instructed pt to be gentle with behind back motions and he should not perform AAROM or AROM behind back into tightness or pain.  Correct exercise form.  Answered pt's wife questions and demonstrated gentle STW to UT. Person educated: Patient and Spouse Education method: Explanation, Demonstration, Tactile cues, Verbal cues, Education comprehension: verbalized understanding, returned demonstration, verbal cues required, and tactile cues required     HOME  EXERCISE PROGRAM: Access Code: [facility] URL: [email or web address] Date: 07/17/2021 Prepared by: Ronny Flurry  Exercises Seated Scapular Retraction - 3-5 x daily - 7 x weekly - 1 sets - 10 reps Circular Shoulder Pendulum with Table Support - 3-5 x daily - 7 x weekly - 1 sets - 20 reps Standing Elbow Flexion Extension AROM - 3-5 x daily - 7 x weekly - 1 sets - 20 reps Supine Shoulder External Rotation with Dowel at 20 Degrees of Abduction - 2 x daily - 7 x weekly - 1 sets - 15 reps - 5 hold Supine Shoulder Wand flexion AAROM - 2 x daily - 7 x weekly - 1-2 sets - 10 reps Supine Shoulder Flexion AROM - 2 x daily - 7 x weekly - 2 sets - 10 reps Isometric Shoulder Flexion at Wall - 1 x daily - 5 x weekly - 1-2 sets - 10 reps - 5 seconds hold Isometric Shoulder Abduction at Wall - 1 x daily - 5 x weekly - 1-2 sets - 10 reps - 5 seconds hold Isometric Shoulder Extension at Wall - 1 x daily - 7 x weekly - 1-2 sets - 10 reps - 5 seconds hold Single Arm Serratus Punches - 1 x daily - 3-4 x weekly - 2 sets - 10 reps  with YTB Supine Shoulder Alphabet - 1x daily - 4 x weekly - 1 reps  with 1# Prone Shoulder Extension - Single Arm - 1 x daily - 5-6 x weekly - 2 sets - 10 reps Standing Shoulder Flexion Wall Walk - 2 x daily - 7 x weekly - 1 sets - 10 reps Standing Shoulder Flexion AAROM with Dowel - 2 x daily - 7 x weekly - 1 sets - 10 reps Sidelying Shoulder External Rotation - 1 x daily - 5-6 x weekly - 2 sets - 10 reps Rows with scapular retraction with resistance 3x/wk, 2- 3 sets of 10 reps Shoulder extension with retraction with resistance 3x/wk, 2-3 sets of 10 reps         ASSESSMENT:   CLINICAL IMPRESSION: Pt is making good progress in all areas and is progressing appropriately with protocol.  PT is progressing intensity of exercises with adding light resistance per protocol.  Pt performed exercises well with cuing and instruction in correct form.  Pt reports having pain with  performing reaching behind back at home and was instructed to not perform in a painful or tight range.  PT instructed pt to gently perform reaching behind back.  He is still limited with functional IR.  Pt tolerated PROM well and has tightness at end ranges.  He reports having improved cervical pain overall; his wife has been massaging his neck.  PT provided STM to cervical paraspinals and UT.  Pt had some tightness though no moderate tightness present.  He is wearing a cervical brace/collar from [facility].  Pt responded well  to Rx reporting no increased pain after Rx.  He should cont to benefit from cont skilled PT services per protocol to address ongoing goals and to restore desired level of function.      Objective impairments include decreased ROM, decreased strength, hypomobility, increased edema, increased fascial restrictions, increased muscle spasms, impaired flexibility, impaired UE functional use, improper body mechanics, postural dysfunction, and pain. These impairments are limiting patient from cleaning, community activity, driving, meal prep, occupation, laundry, yard work, and shopping.            GOALS:     SHORT TERM GOALS:   STG Name Target Date Goal status  1 Pt will become independent with HEP in order to demonstrate synthesis of PT education.    05/16/2021 GOAL MET  2 Pt will score at least 23 pt increase on FOTO to demonstrate functional improvement in MCII and pt perceived function.     06/13/2021 GOAL MET  3 Pt will be able to demonstrate fwd flexion to 130 and ER to 30 in order to demonstrate functional improvement in UE ROM benchmark.    06/13/2021 75%  4 Pt will be able to demonstrate full symmetrical ROM in all planes in order to demonstrate functional improvement in UE function for progression to next phase of rehabilitation.     06/13/2021 INITIAL    LONG TERM GOALS:    LTG Name Target Date Goal status  1 Pt  will become independent with final HEP in order to  demonstrate synthesis of PT education.     08/26/2021 INITIAL  2 Pt will score >/= 53 on FOTO to demonstrate functional improvement in R shoulder function for return to PLOF.     08/26/2021 99%  3 Pt will be able to reach [facility] and carry/hold >5 lbs in order to demonstrate functional improvement in R UE strength for return to PLOF and exercise.    08/26/2021 INITIAL  4 Pt will be able to demonstrate full AROM in all planes in order to demonstrate functional improvement in UE function for self-care and house hold duties.    08/26/2021 INITIAL    PLAN:   PLANNED INTERVENTIONS: Therapeutic exercises, Therapeutic activity, Neuro Muscular re-education, Patient/Family education, Joint mobilization, Aquatic Therapy, Dry Needling, Electrical stimulation, Spinal mobilization, Cryotherapy, Moist heat, scar mobilization, Splintting, Taping, Vasopneumatic device, Traction, Ultrasound, Ionotophoresis 38m/ml Dexamethasone, and Manual therapy   PLAN FOR NEXT SESSION: Cont with ROM and strengthening per MD Reverse TSA protocol.  MD included for PT to address neck and trapezius tightness and strengthening on PT script.  Check UT tightness and cont with STW to UT and cervical paraspinals.   RSelinda MichaelsIII PT, DPT 08/14/21 11:11 PM

## 2021-08-19 ENCOUNTER — Ambulatory Visit (HOSPITAL_BASED_OUTPATIENT_CLINIC_OR_DEPARTMENT_OTHER): Payer: Medicare Other | Admitting: Physical Therapy

## 2021-08-19 ENCOUNTER — Other Ambulatory Visit: Payer: Self-pay

## 2021-08-19 ENCOUNTER — Encounter (HOSPITAL_BASED_OUTPATIENT_CLINIC_OR_DEPARTMENT_OTHER): Payer: Self-pay | Admitting: Physical Therapy

## 2021-08-19 DIAGNOSIS — M6281 Muscle weakness (generalized): Secondary | ICD-10-CM

## 2021-08-19 DIAGNOSIS — M25511 Pain in right shoulder: Secondary | ICD-10-CM

## 2021-08-19 DIAGNOSIS — M25611 Stiffness of right shoulder, not elsewhere classified: Secondary | ICD-10-CM | POA: Diagnosis not present

## 2021-08-19 NOTE — Therapy (Signed)
OUTPATIENT PHYSICAL THERAPY TREATMENT NOTE          Patient Name: Dwayne Wade MRN: [id number] DOB:May 18, 1951, 71 y.o., male 34 Date: 08/19/2021  PCP: Alan Ripper, PA REFERRING MD:  Vanetta Mulders, MD  PT End of Session - 08/19/21 1433     Visit Number 28    Number of Visits 32    Date for PT Re-Evaluation 08/26/21    Authorization Type Medicare    Progress Note Due on Visit 83    PT Start Time 1352    PT Stop Time 1432    PT Time Calculation (min) 40 min    Activity Tolerance Patient tolerated treatment well    Behavior During Therapy [facility] for tasks assessed/performed                     Past Medical History:  Diagnosis Date   Arthritis    Hypertension    Pre-diabetes    Past Surgical History:  Procedure Laterality Date   REVERSE SHOULDER ARTHROPLASTY Right 04/30/2021   Procedure: RIGHT REVERSE SHOULDER ARTHROPLASTY;  Surgeon: Vanetta Mulders, MD;  Location: [place];  Service: Orthopedics;  Laterality: Right;   TONSILLECTOMY  1990   There are no problems to display for this patient.    REFERRING DIAG:  M12.811 (ICD-10-CM) - Rotator cuff arthropathy of right shoulder  Surgery date 04/30/2021 Total reverse shoulder  THERAPY DIAG:  Decreased right shoulder range of motion  Acute pain of right shoulder  Muscle weakness (generalized)  PERTINENT HISTORY: Pre-diabetic, HTN   PRECAUTIONS: Shoulder- no R UE WB.  No UBE per protocol.  SUBJECTIVE:  Pt is 15 weeks and 6 days s/p R reverse total shoulder.    -Pt reports compliance with HEP.  Pt states he typically has 2/10 pain in R shoulder which increases to 2.5-3/10 after HEP.  He states he had some pain in R shoulder just sitting down and resting the other night.  Pt states he has difficulty with reaching behind back and continued difficulty with AAROM behind his back.  Pt states his neck feels better and his wife massages it.  Pt does use the cervical brace he ordered from [employer].    Pt states he performed standing ER with RTB at home and had increased pain.   RESPONSE TO PRIOR RX:  Pt reports no increased pain after prior Rx.  He reports 2/10 R shoulder pain after prior Rx.  FUNCTIONAL IMPROVEMENTS:  reaching over to his bedside table, making bed, reaching to grab a water bottle, able to thread belt in his pants, shaving FUNCTIONAL LIMITATIONS:  pt had some shoulder pain when cleaning the toilet.  lifting objects including grocery bags, sleeping, carrying objects, vacuuming/mopping, household cleaning/IADLs, reaching, and overhead activities.   PAIN:  Are you having pain? Yes NRPS:  2/10  Location: R shoulder  Pt reports 1/10 cervical pain, feels tense.     OBJECTIVE:   TODAY'S TREATMENT:     Therapeutic Exercise: -Reviewed response to prior Rx, HEP compliance, reported functional progress and deficits, and pain level. -Pt performed:  Seated UT stretch 2x20 sec bilat Standing ER with arm at side with YTB 2x10 reps Standing Jobe's flexion with 1# x10 reps and with 2# 2x10 reps Standing wand IR 2  x 10 reps Standing scaption AROM x 10 reps and with 1# 2x10 reps   -Pt received R shoulder PROM in flexion, scaption, abduction, ER, and IR in supine per protocol range and pt/tissue tolerance -See  Pt education provided below.  Manual Therapy:  Pt received STM to bilat cervical paraspinals and R UT in supine to improve soft tissue tightness and pain.     PATIENT EDUCATION: Education details:  Educated pt in Ut stretch.  Reviewed HEP and instructed pt to not perform ER with RTB at home.  Instructed pt in performing standing functional IR AROM.  Instructed pt to be gentle with behind back motions and he should not perform AAROM or AROM behind back into tightness or pain.  Correct exercise form.   Person educated: Patient and Spouse Education method: Explanation, Demonstration, Tactile cues, Verbal cues, Education comprehension: verbalized understanding, returned  demonstration, verbal cues required, and tactile cues required     HOME EXERCISE PROGRAM: Access Code: [facility] URL: [email or web address] Date: 07/17/2021 Prepared by: Ronny Flurry  Exercises Seated Scapular Retraction - 3-5 x daily - 7 x weekly - 1 sets - 10 reps Circular Shoulder Pendulum with Table Support - 3-5 x daily - 7 x weekly - 1 sets - 20 reps Standing Elbow Flexion Extension AROM - 3-5 x daily - 7 x weekly - 1 sets - 20 reps Supine Shoulder External Rotation with Dowel at 20 Degrees of Abduction - 2 x daily - 7 x weekly - 1 sets - 15 reps - 5 hold Supine Shoulder Wand flexion AAROM - 2 x daily - 7 x weekly - 1-2 sets - 10 reps Supine Shoulder Flexion AROM - 2 x daily - 7 x weekly - 2 sets - 10 reps Isometric Shoulder Flexion at Wall - 1 x daily - 5 x weekly - 1-2 sets - 10 reps - 5 seconds hold Isometric Shoulder Abduction at Wall - 1 x daily - 5 x weekly - 1-2 sets - 10 reps - 5 seconds hold Isometric Shoulder Extension at Wall - 1 x daily - 7 x weekly - 1-2 sets - 10 reps - 5 seconds hold Single Arm Serratus Punches - 1 x daily - 3-4 x weekly - 2 sets - 10 reps  with YTB Supine Shoulder Alphabet - 1x daily - 4 x weekly - 1 reps  with 1# Prone Shoulder Extension - Single Arm - 1 x daily - 5-6 x weekly - 2 sets - 10 reps Standing Shoulder Flexion Wall Walk - 2 x daily - 7 x weekly - 1 sets - 10 reps Standing Shoulder Flexion AAROM with Dowel - 2 x daily - 7 x weekly - 1 sets - 10 reps Sidelying Shoulder External Rotation - 1 x daily - 5-6 x weekly - 2 sets - 10 reps Rows with scapular retraction with resistance 3x/wk, 2- 3 sets of 10 reps Shoulder extension with retraction with resistance 3x/wk, 2-3 sets of 10 reps         ASSESSMENT:   CLINICAL IMPRESSION: Pt is making good progress in all areas and is progressing appropriately with protocol.  He continues to have cervical pain though seems to be improving.  He has a trigger point in R UT.  PT  performed STM and pt responded well.  Pt has been performing ER with RTB at home which PT has not instructed him to do.  PT educated pt.  Pt is improving with R shoulder strength as evidenced by performance of increased resistance with standing jobe's flexion and standing scaption and also having improved form with standing T band ER.  Pt performed exercises well with cuing and instruction in correct form.  Pt responded well to Rx reporting  no increased pain after Rx.  He should cont to benefit from cont skilled PT services per protocol to address ongoing goals and to restore desired level of function.      Objective impairments include decreased ROM, decreased strength, hypomobility, increased edema, increased fascial restrictions, increased muscle spasms, impaired flexibility, impaired UE functional use, improper body mechanics, postural dysfunction, and pain. These impairments are limiting patient from cleaning, community activity, driving, meal prep, occupation, laundry, yard work, and shopping.            GOALS:     SHORT TERM GOALS:   STG Name Target Date Goal status  1 Pt will become independent with HEP in order to demonstrate synthesis of PT education.    05/16/2021 GOAL MET  2 Pt will score at least 23 pt increase on FOTO to demonstrate functional improvement in MCII and pt perceived function.     06/13/2021 GOAL MET  3 Pt will be able to demonstrate fwd flexion to 130 and ER to 30 in order to demonstrate functional improvement in UE ROM benchmark.    06/13/2021 75%  4 Pt will be able to demonstrate full symmetrical ROM in all planes in order to demonstrate functional improvement in UE function for progression to next phase of rehabilitation.     06/13/2021 INITIAL    LONG TERM GOALS:    LTG Name Target Date Goal status  1 Pt  will become independent with final HEP in order to demonstrate synthesis of PT education.     08/26/2021 INITIAL  2 Pt will score >/= 53 on FOTO to  demonstrate functional improvement in R shoulder function for return to PLOF.     08/26/2021 99%  3 Pt will be able to reach [facility] and carry/hold >5 lbs in order to demonstrate functional improvement in R UE strength for return to PLOF and exercise.    08/26/2021 INITIAL  4 Pt will be able to demonstrate full AROM in all planes in order to demonstrate functional improvement in UE function for self-care and house hold duties.    08/26/2021 INITIAL    PLAN:   PLANNED INTERVENTIONS: Therapeutic exercises, Therapeutic activity, Neuro Muscular re-education, Patient/Family education, Joint mobilization, Aquatic Therapy, Dry Needling, Electrical stimulation, Spinal mobilization, Cryotherapy, Moist heat, scar mobilization, Splintting, Taping, Vasopneumatic device, Traction, Ultrasound, Ionotophoresis 60m/ml Dexamethasone, and Manual therapy   PLAN FOR NEXT SESSION: Cont with ROM and strengthening per MD Reverse TSA protocol.  MD included for PT to address neck and trapezius tightness and strengthening on PT script.  Cont UT stretch and STW to UT and cervical paraspinals.   RSelinda MichaelsIII PT, DPT 08/19/21 5:01 PM

## 2021-08-21 ENCOUNTER — Encounter (HOSPITAL_BASED_OUTPATIENT_CLINIC_OR_DEPARTMENT_OTHER): Payer: Self-pay | Admitting: Physical Therapy

## 2021-08-21 ENCOUNTER — Ambulatory Visit (HOSPITAL_BASED_OUTPATIENT_CLINIC_OR_DEPARTMENT_OTHER): Payer: Medicare Other | Admitting: Physical Therapy

## 2021-08-21 ENCOUNTER — Other Ambulatory Visit: Payer: Self-pay

## 2021-08-21 DIAGNOSIS — M6281 Muscle weakness (generalized): Secondary | ICD-10-CM

## 2021-08-21 DIAGNOSIS — M25611 Stiffness of right shoulder, not elsewhere classified: Secondary | ICD-10-CM | POA: Diagnosis not present

## 2021-08-21 DIAGNOSIS — M25511 Pain in right shoulder: Secondary | ICD-10-CM

## 2021-08-21 NOTE — Therapy (Signed)
OUTPATIENT PHYSICAL THERAPY TREATMENT NOTE          Patient Name: Dwayne Wade MRN: [id number] DOB:1951-06-25, 71 y.o., male 48 Date: 08/21/2021  PCP: Alan Ripper, PA REFERRING MD:  Vanetta Mulders, MD  PT End of Session - 08/21/21 0809     Visit Number 29    Number of Visits 32    Date for PT Re-Evaluation 08/26/21    Authorization Type Medicare    Progress Note Due on Visit 68    PT Start Time 0807    PT Stop Time 0847    PT Time Calculation (min) 40 min    Activity Tolerance Patient tolerated treatment well    Behavior During Therapy [facility] for tasks assessed/performed                     Past Medical History:  Diagnosis Date   Arthritis    Hypertension    Pre-diabetes    Past Surgical History:  Procedure Laterality Date   REVERSE SHOULDER ARTHROPLASTY Right 04/30/2021   Procedure: RIGHT REVERSE SHOULDER ARTHROPLASTY;  Surgeon: Vanetta Mulders, MD;  Location: [place];  Service: Orthopedics;  Laterality: Right;   TONSILLECTOMY  1990   There are no problems to display for this patient.    REFERRING DIAG:  M12.811 (ICD-10-CM) - Rotator cuff arthropathy of right shoulder  Surgery date 04/30/2021 Total reverse shoulder  THERAPY DIAG:  Decreased right shoulder range of motion  Acute pain of right shoulder  Muscle weakness (generalized)  PERTINENT HISTORY: Pre-diabetic, HTN   PRECAUTIONS: Shoulder- no R UE WB.  No UBE per protocol.  SUBJECTIVE:  Pt is 16 weeks and 1 day s/p R reverse total shoulder.    -Pt reports compliance with HEP.  Pt states he typically has 2/10 pain in R shoulder after HEP.  Pt states he has difficulty with reaching behind back and continued difficulty with AAROM behind his back.  Pt states his wife massages his neck which makes it feel better.  Pt does use the cervical brace he ordered from [employer].   Pt states he performed standing ER with RTB at home and had increased pain.   RESPONSE TO PRIOR RX:  Pt  reports no increased pain after prior Rx.  He reports 2/10 R shoulder pain after prior Rx.  FUNCTIONAL IMPROVEMENTS:  reaching over to his bedside table, making bed, reaching to grab a water bottle, able to thread belt in his pants, shaving FUNCTIONAL LIMITATIONS:  pt had some shoulder pain when cleaning the toilet.  lifting objects including grocery bags, sleeping, carrying objects, vacuuming/mopping, household cleaning/IADLs, reaching, and overhead activities.  Reaching behind back   PAIN:  Are you having pain? Yes NRPS:  1 - 1.5/10  Location: R shoulder  Pt reports .5-1/10 cervical pain     OBJECTIVE:   TODAY'S TREATMENT:  R shoulder flexion AROM:  122 deg in standing    Therapeutic Exercise: -Reviewed response to prior Rx, HEP compliance, reported functional progress and deficits, and pain level. -Pt performed:  Seated UT stretch 2x20 sec bilat  Supine rhythmic stabs at 90/60/120 deg 2x30 sec each Standing ER with arm at side with YTB 3x10 reps Standing Jobe's flexion with 2# 2x10 reps Standing scaption with 1# 2x10 reps           IR submax isometric with 5 sec hold x 5 reps  -Pt received R shoulder PROM in flexion, scaption, abduction, ER, and IR in supine per protocol range and  pt/tissue tolerance -See Pt education provided below.  Manual Therapy:  Pt received STM to bilat cervical paraspinals and R UT in supine to improve soft tissue tightness and pain.     PATIENT EDUCATION: Education details:  Educated pt in Ut stretch.  Reviewed HEP and instructed pt to not perform ER with RTB at home.  Instructed pt to be gentle with behind back motions and he should not perform AAROM or AROM behind back into tightness or pain.  Correct exercise form.   Person educated: Patient and Spouse Education method: Explanation, Demonstration, Tactile cues, Verbal cues, Education comprehension: verbalized understanding, returned demonstration, verbal cues required, and tactile cues required      HOME EXERCISE PROGRAM: Access Code: [facility] URL: [email or web address] Date: 07/17/2021 Prepared by: Ronny Flurry  Exercises Seated Scapular Retraction - 3-5 x daily - 7 x weekly - 1 sets - 10 reps Circular Shoulder Pendulum with Table Support - 3-5 x daily - 7 x weekly - 1 sets - 20 reps Standing Elbow Flexion Extension AROM - 3-5 x daily - 7 x weekly - 1 sets - 20 reps Supine Shoulder External Rotation with Dowel at 20 Degrees of Abduction - 2 x daily - 7 x weekly - 1 sets - 15 reps - 5 hold Supine Shoulder Wand flexion AAROM - 2 x daily - 7 x weekly - 1-2 sets - 10 reps Supine Shoulder Flexion AROM - 2 x daily - 7 x weekly - 2 sets - 10 reps Isometric Shoulder Flexion at Wall - 1 x daily - 5 x weekly - 1-2 sets - 10 reps - 5 seconds hold Isometric Shoulder Abduction at Wall - 1 x daily - 5 x weekly - 1-2 sets - 10 reps - 5 seconds hold Isometric Shoulder Extension at Wall - 1 x daily - 7 x weekly - 1-2 sets - 10 reps - 5 seconds hold Single Arm Serratus Punches - 1 x daily - 3-4 x weekly - 2 sets - 10 reps  with YTB Supine Shoulder Alphabet - 1x daily - 4 x weekly - 1 reps  with 1# Prone Shoulder Extension - Single Arm - 1 x daily - 5-6 x weekly - 2 sets - 10 reps Standing Shoulder Flexion Wall Walk - 2 x daily - 7 x weekly - 1 sets - 10 reps Standing Shoulder Flexion AAROM with Dowel - 2 x daily - 7 x weekly - 1 sets - 10 reps Sidelying Shoulder External Rotation - 1 x daily - 5-6 x weekly - 2 sets - 10 reps Rows with scapular retraction with resistance 3x/wk, 2- 3 sets of 10 reps Shoulder extension with retraction with resistance 3x/wk, 2-3 sets of 10 reps         ASSESSMENT:   CLINICAL IMPRESSION: Pt is making good progress in all areas and is progressing appropriately with protocol.  He continues to have cervical pain though seems to be improving.  He has a trigger point in R UT.  PT performed STM and pt responded well.  Pt is improving with R shoulder  strength as evidenced by performance of exercises.  Pt is progressing with exercises per protocol and is improving with UE elevation.  He continues to have difficulty and limitations with reaching behind back.   Pt responded well to Rx reporting no increased pain after Rx.  He should cont to benefit from cont skilled PT services per protocol to address ongoing goals and to restore desired level of function.  Objective impairments include decreased ROM, decreased strength, hypomobility, increased edema, increased fascial restrictions, increased muscle spasms, impaired flexibility, impaired UE functional use, improper body mechanics, postural dysfunction, and pain. These impairments are limiting patient from cleaning, community activity, driving, meal prep, occupation, laundry, yard work, and shopping.            GOALS:     SHORT TERM GOALS:   STG Name Target Date Goal status  1 Pt will become independent with HEP in order to demonstrate synthesis of PT education.    05/16/2021 GOAL MET  2 Pt will score at least 23 pt increase on FOTO to demonstrate functional improvement in MCII and pt perceived function.     06/13/2021 GOAL MET  3 Pt will be able to demonstrate fwd flexion to 130 and ER to 30 in order to demonstrate functional improvement in UE ROM benchmark.    06/13/2021 75%  4 Pt will be able to demonstrate full symmetrical ROM in all planes in order to demonstrate functional improvement in UE function for progression to next phase of rehabilitation.     06/13/2021 INITIAL    LONG TERM GOALS:    LTG Name Target Date Goal status  1 Pt  will become independent with final HEP in order to demonstrate synthesis of PT education.     08/26/2021 INITIAL  2 Pt will score >/= 53 on FOTO to demonstrate functional improvement in R shoulder function for return to PLOF.     08/26/2021 99%  3 Pt will be able to reach [facility] and carry/hold >5 lbs in order to demonstrate functional improvement in R UE  strength for return to PLOF and exercise.    08/26/2021 INITIAL  4 Pt will be able to demonstrate full AROM in all planes in order to demonstrate functional improvement in UE function for self-care and house hold duties.    08/26/2021 INITIAL    PLAN:   PLANNED INTERVENTIONS: Therapeutic exercises, Therapeutic activity, Neuro Muscular re-education, Patient/Family education, Joint mobilization, Aquatic Therapy, Dry Needling, Electrical stimulation, Spinal mobilization, Cryotherapy, Moist heat, scar mobilization, Splintting, Taping, Vasopneumatic device, Traction, Ultrasound, Ionotophoresis 48m/ml Dexamethasone, and Manual therapy   PLAN FOR NEXT SESSION: Cont with ROM and strengthening per MD Reverse TSA protocol.  MD included for PT to address neck and trapezius tightness and strengthening on PT script.  Cont UT stretch and STW to UT and cervical paraspinals.   RSelinda MichaelsIII PT, DPT 08/21/21 2:22 PM

## 2021-08-26 ENCOUNTER — Ambulatory Visit (HOSPITAL_BASED_OUTPATIENT_CLINIC_OR_DEPARTMENT_OTHER): Payer: Medicare Other | Admitting: Physical Therapy

## 2021-08-28 ENCOUNTER — Ambulatory Visit (HOSPITAL_BASED_OUTPATIENT_CLINIC_OR_DEPARTMENT_OTHER): Payer: Medicare Other | Admitting: Physical Therapy

## 2021-09-16 ENCOUNTER — Ambulatory Visit (HOSPITAL_BASED_OUTPATIENT_CLINIC_OR_DEPARTMENT_OTHER): Payer: Medicare Other | Admitting: Physical Therapy

## 2021-09-18 ENCOUNTER — Ambulatory Visit (HOSPITAL_BASED_OUTPATIENT_CLINIC_OR_DEPARTMENT_OTHER): Payer: Medicare Other | Attending: Orthopaedic Surgery | Admitting: Physical Therapy

## 2021-09-18 ENCOUNTER — Other Ambulatory Visit: Payer: Self-pay

## 2021-09-18 DIAGNOSIS — M25611 Stiffness of right shoulder, not elsewhere classified: Secondary | ICD-10-CM | POA: Diagnosis not present

## 2021-09-18 DIAGNOSIS — M6281 Muscle weakness (generalized): Secondary | ICD-10-CM | POA: Diagnosis present

## 2021-09-18 DIAGNOSIS — M25511 Pain in right shoulder: Secondary | ICD-10-CM | POA: Diagnosis present

## 2021-09-18 NOTE — Therapy (Addendum)
OUTPATIENT PHYSICAL THERAPY TREATMENT NOTE /PROGRESS NOTE       Progress Note Reporting Period 07/17/2021 to 09/18/2021   See note below for Objective Data and Assessment of Progress/Goals.           Patient Name: Dwayne Wade MRN: [id number] DOB:1951/02/21, 71 y.o., male Today's Date: 09/19/2021  PCP: Alan Ripper, PA REFERRING MD:  Vanetta Mulders, MD  PT End of Session - 09/18/21 1536     Visit Number 30    Number of Visits 35    Date for PT Re-Evaluation 10/24/21    Authorization Type Medicare    PT Start Time 1450    PT Stop Time 1529    PT Time Calculation (min) 39 min    Activity Tolerance Patient tolerated treatment well    Behavior During Therapy [facility] for tasks assessed/performed                      Past Medical History:  Diagnosis Date   Arthritis    Hypertension    Pre-diabetes    Past Surgical History:  Procedure Laterality Date   REVERSE SHOULDER ARTHROPLASTY Right 04/30/2021   Procedure: RIGHT REVERSE SHOULDER ARTHROPLASTY;  Surgeon: Vanetta Mulders, MD;  Location: [place];  Service: Orthopedics;  Laterality: Right;   TONSILLECTOMY  1990   There are no problems to display for this patient.    REFERRING DIAG:  M12.811 (ICD-10-CM) - Rotator cuff arthropathy of right shoulder  Surgery date 04/30/2021 Total reverse shoulder  THERAPY DIAG:  Decreased right shoulder range of motion  Acute pain of right shoulder  Muscle weakness (generalized)  PERTINENT HISTORY: Pre-diabetic, HTN   PRECAUTIONS: Shoulder- no R UE WB.  No UBE per protocol.  SUBJECTIVE:  Pt is 20 weeks and 1 day s/p R reverse total shoulder.  Pt has been absent from PT since 08/21/2021.  He has been away from PT due to having a death in the family.    Pt's wife reports he is still having cervical pain and he noticed it more when he lifted a case of water.  Pt has disturbed sleep due to cervical pain.    -Pt reports compliance with HEP.  Pt reports he has  some pain and soreness with HEP and can occasionally be a 3/10 which doesn't last long.  Pt states he typically has 2/10 pain in R shoulder after HEP.  Pt states he has difficulty with reaching behind back and continued difficulty with AAROM behind his back.  Pt does use the cervical brace he ordered from [employer].    FUNCTIONAL IMPROVEMENTS:  mopping, sweeping, scrubbing the tub, putting a light bulb overhead, reaching including reaching overhead.  Lifting grocery bag.   FUNCTIONAL LIMITATIONS:  reaching behind back, threading belt, putting wallet in back pocket.  lifting objects including grocery bags, carrying objects.  Hasn't returned to work.   PAIN:  Are you having pain? Yes NRPS:  2.5/10, 3/10 pain,   .5-1/10 best Location: R shoulder  Pt reports 2.5/10 cervical pain, 3.5/10 pain worst, and 0/10 best     OBJECTIVE:   TODAY'S TREATMENT:                         L shoulder AROM/PROM:                                     Flexion:  134/151    Scaption:  126    Abd:  118/114 deg                                     ER:  52/60 deg    IR:  50 deg    L shoulder strength:  5/5 in available ROM in flexion, scaption, Abd, and ER   FOTO:  66 with a goal of 52 at visit #18    PALPATION:  very little tenderness in anterior R shoulder.  Tender in bilat cervical paraspinals.  Pt has soft tissue tightness in L > R UT.    -Reviewed response to prior Rx, HEP compliance, reported functional progress and deficits, and pain level. -See Pt education provided below.       PATIENT EDUCATION: Education details:  HEP, POC, objective findings, and goal progress.    Person educated: Patient and Spouse Education method: Explanation, Demonstration, Tactile cues, Verbal cues, Education comprehension: verbalized understanding, returned demonstration, verbal cues required, and tactile cues required     HOME EXERCISE PROGRAM: Access Code: [facility] URL: [email or web address] Date:  07/17/2021 Prepared by: Ronny Flurry  Exercises Seated Scapular Retraction - 3-5 x daily - 7 x weekly - 1 sets - 10 reps Circular Shoulder Pendulum with Table Support - 3-5 x daily - 7 x weekly - 1 sets - 20 reps Standing Elbow Flexion Extension AROM - 3-5 x daily - 7 x weekly - 1 sets - 20 reps Supine Shoulder External Rotation with Dowel at 20 Degrees of Abduction - 2 x daily - 7 x weekly - 1 sets - 15 reps - 5 hold Supine Shoulder Wand flexion AAROM - 2 x daily - 7 x weekly - 1-2 sets - 10 reps Supine Shoulder Flexion AROM - 2 x daily - 7 x weekly - 2 sets - 10 reps Isometric Shoulder Flexion at Wall - 1 x daily - 5 x weekly - 1-2 sets - 10 reps - 5 seconds hold Isometric Shoulder Abduction at Wall - 1 x daily - 5 x weekly - 1-2 sets - 10 reps - 5 seconds hold Isometric Shoulder Extension at Wall - 1 x daily - 7 x weekly - 1-2 sets - 10 reps - 5 seconds hold Single Arm Serratus Punches - 1 x daily - 3-4 x weekly - 2 sets - 10 reps  with YTB Supine Shoulder Alphabet - 1x daily - 4 x weekly - 1 reps  with 1# Prone Shoulder Extension - Single Arm - 1 x daily - 5-6 x weekly - 2 sets - 10 reps Standing Shoulder Flexion Wall Walk - 2 x daily - 7 x weekly - 1 sets - 10 reps Standing Shoulder Flexion AAROM with Dowel - 2 x daily - 7 x weekly - 1 sets - 10 reps Sidelying Shoulder External Rotation - 1 x daily - 5-6 x weekly - 2 sets - 10 reps Rows with scapular retraction with resistance 3x/wk, 2- 3 sets of 10 reps Shoulder extension with retraction with resistance 3x/wk, 2-3 sets of 10 reps         ASSESSMENT:   CLINICAL IMPRESSION: Pt has been absent from PT since 2021/08/24 due to having a death in the family and some scheduling issues.  Pt is making great progress in all areas including function, strength, and ROM.  He demonstrates improved R shoulder strength to 5/5 in available ranges.  He has some limitations in ROM though has made great progress in ROM.  Pt demonstrates improved UE  elevation and reports he is able to perform overhead reaching activities.  Pt has also been able to perform housecleaning including mopping and sweeping without adverse effects.  He is able to perform reaching activities well except tightness and limitations with reaching backward. He continues to have cervical pain.  Pt demonstrates a good improvement in self perceived disability with FOTO score improving from 51 prior to 66 currently.  He met his FOTO goal.  Pt is making good progress toward goals.  Pt should benefit from further PT to establish long term HEP for shoulder and to cont to treat cervical pain and UT tightness.       Objective impairments include decreased ROM, decreased strength, hypomobility, increased edema, increased fascial restrictions, increased muscle spasms, impaired flexibility, impaired UE functional use, improper body mechanics, postural dysfunction, and pain. These impairments are limiting patient from cleaning, community activity, driving, meal prep, occupation, laundry, yard work, and shopping.          GOALS:     SHORT TERM GOALS:   STG Name Target Date Goal status  1 Pt will become independent with HEP in order to demonstrate synthesis of PT education.    05/16/2021 GOAL MET  2 Pt will score at least 23 pt increase on FOTO to demonstrate functional improvement in MCII and pt perceived function.     06/13/2021 GOAL MET  3 Pt will be able to demonstrate fwd flexion to 130 and ER to 30 in order to demonstrate functional improvement in UE ROM benchmark.    06/13/2021 75%  4 Pt will be able to demonstrate full symmetrical ROM in all planes in order to demonstrate functional improvement in UE function for progression to next phase of rehabilitation.     06/13/2021 PROGRESSING    LONG TERM GOALS:    LTG Name Target Date Goal status  1 Pt  will become independent with final HEP in order to demonstrate synthesis of PT education.     10/23/2021 PROGRESSING  2 Pt will  score >/= 53 on FOTO to demonstrate functional improvement in R shoulder function for return to PLOF.     08/26/2021 GOAL MET  3 Pt will be able to reach [facility] and carry/hold >5 lbs in order to demonstrate functional improvement in R UE strength for return to PLOF and exercise.    08/26/2021 GOAL MET  4 Pt will be able to demonstrate full AROM in all planes in order to demonstrate functional improvement in UE function for self-care and house hold duties.    10/23/2021 PROGRESSING  5 Pt will report at least a 25% improvement in cervical pain and sx's overall. 10/23/2021  INITIAL    PLAN:  FREQUENCY:  1x/wk or once every other week  DURATION: 5 WEEKS  PLANNED INTERVENTIONS: Therapeutic exercises, Therapeutic activity, Neuro Muscular re-education, Patient/Family education, Joint mobilization, Aquatic Therapy, Dry Needling, Electrical stimulation, Spinal mobilization, Cryotherapy, Moist heat, scar mobilization, Splintting, Taping, Vasopneumatic device, Traction, Ultrasound, Ionotophoresis 36m/ml Dexamethasone, and Manual therapy   PLAN FOR NEXT SESSION: Update HEP and establish long term HEP.  Cont per MD Reverse TSA protocol.  MD included for PT to address neck and trapezius tightness and strengthening on PT script.  Cont UT stretch and STW to UT and cervical paraspinals.  Pt sees MD in April.    RSelinda MichaelsIII PT, DPT 09/19/21 11:30 AM

## 2021-09-19 ENCOUNTER — Encounter (HOSPITAL_BASED_OUTPATIENT_CLINIC_OR_DEPARTMENT_OTHER): Payer: Self-pay | Admitting: Physical Therapy

## 2021-09-23 ENCOUNTER — Ambulatory Visit (HOSPITAL_BASED_OUTPATIENT_CLINIC_OR_DEPARTMENT_OTHER): Payer: Medicare Other | Admitting: Physical Therapy

## 2021-09-25 ENCOUNTER — Encounter (HOSPITAL_BASED_OUTPATIENT_CLINIC_OR_DEPARTMENT_OTHER): Payer: BC Managed Care – PPO | Admitting: Physical Therapy

## 2021-09-29 NOTE — Therapy (Addendum)
OUTPATIENT PHYSICAL THERAPY TREATMENT NOTE            Patient Name: Dwayne Wade MRN: [id number] DOB:1950-08-10, 71 y.o., male 84 Date: 09/30/2021  PCP: Alan Ripper, PA REFERRING MD:  Vanetta Mulders, MD  PT End of Session - 09/30/21 0900     Visit Number 31    Number of Visits 35    Date for PT Re-Evaluation 10/24/21    Authorization Type Medicare    PT Start Time 0855    PT Stop Time 0935    PT Time Calculation (min) 40 min    Activity Tolerance Patient tolerated treatment well    Behavior During Therapy [facility] for tasks assessed/performed                       Past Medical History:  Diagnosis Date   Arthritis    Hypertension    Pre-diabetes    Past Surgical History:  Procedure Laterality Date   REVERSE SHOULDER ARTHROPLASTY Right 04/30/2021   Procedure: RIGHT REVERSE SHOULDER ARTHROPLASTY;  Surgeon: Vanetta Mulders, MD;  Location: [place];  Service: Orthopedics;  Laterality: Right;   TONSILLECTOMY  1990   There are no problems to display for this patient.    REFERRING DIAG:  M12.811 (ICD-10-CM) - Rotator cuff arthropathy of right shoulder  Surgery date 04/30/2021 Total reverse shoulder  THERAPY DIAG:  Decreased right shoulder range of motion  Acute pain of right shoulder  Muscle weakness (generalized)  PERTINENT HISTORY: Pre-diabetic, HTN   PRECAUTIONS: Shoulder- no R UE WB.  No UBE per protocol.  SUBJECTIVE:  Pt is 21 weeks and 6 days s/p R reverse total shoulder.    Pt states his shoulder is doing better.  Pt has disturbed sleep due to cervical pain though is a little better.    -Pt reports compliance with HEP.  Pt reports he has some pain and soreness with HEP and can occasionally be a 2.5/10 which doesn't last long.  Pt states he has difficulty with reaching behind back though is a little better.  He has difficulty threading belt.  Pt uses the cervical brace he ordered from [employer] especially when watching TV.  Pt  states it helps sometimes.     FUNCTIONAL IMPROVEMENTS:  mopping, sweeping, scrubbing the tub, putting a light bulb overhead, reaching including reaching overhead.  Lifting grocery bag.   FUNCTIONAL LIMITATIONS:  reaching behind back, threading belt, putting wallet in back pocket.  lifting objects including grocery bags, carrying objects.  Hasn't returned to work.   PAIN:  Are you having pain? Yes NRPS:  .5/10 Location: R shoulder  Pt reports 1.5/10 cervical pain     OBJECTIVE:   TODAY'S TREATMENT:                       Therapeutic Exercise:   -Reviewed response to prior Rx, HEP compliance, reported functional progress and deficits, and pain level. -Pt performed:            Seated UT stretch 3x20 sec bilat  Standing wand IR 2x10 reps  Standing Functional IR AROM x 10 reps            Standing ER with arm at side with RTB 2x10 reps Standing Jobe's flexion with 2# X 12 reps and 3# x10 reps Standing scaption with 2# 2x10 reps           Shelf reaches 2x10 reps  Updated HEP with UT stretch.  Manual Therapy: Pt received STM to bilat cervical paraspinals and R UT in supine to improve soft tissue tightness and pain.       PATIENT EDUCATION: Education details:  Updated HEP and gave pt a HEP handout.  Educated pt in correct form and appropriate frequency.  Instructed pt to not stretch into a painful range, POC, exercise form.  Person educated: Patient and Spouse Education method: Explanation, Demonstration, Tactile cues, Verbal cues, Education comprehension: verbalized understanding, returned demonstration, verbal cues required, and tactile cues required     HOME EXERCISE PROGRAM: Access Code: [facility] URL: [email or web address] Date: 07/17/2021 Prepared by: Ronny Flurry  Exercises Seated Scapular Retraction - 3-5 x daily - 7 x weekly - 1 sets - 10 reps Circular Shoulder Pendulum with Table Support - 3-5 x daily - 7 x weekly - 1 sets - 20 reps Standing Elbow  Flexion Extension AROM - 3-5 x daily - 7 x weekly - 1 sets - 20 reps Supine Shoulder External Rotation with Dowel at 20 Degrees of Abduction - 2 x daily - 7 x weekly - 1 sets - 15 reps - 5 hold Supine Shoulder Wand flexion AAROM - 2 x daily - 7 x weekly - 1-2 sets - 10 reps Supine Shoulder Flexion AROM - 2 x daily - 7 x weekly - 2 sets - 10 reps Isometric Shoulder Flexion at Wall - 1 x daily - 5 x weekly - 1-2 sets - 10 reps - 5 seconds hold Isometric Shoulder Abduction at Wall - 1 x daily - 5 x weekly - 1-2 sets - 10 reps - 5 seconds hold Isometric Shoulder Extension at Wall - 1 x daily - 7 x weekly - 1-2 sets - 10 reps - 5 seconds hold Single Arm Serratus Punches - 1 x daily - 3-4 x weekly - 2 sets - 10 reps  with YTB Supine Shoulder Alphabet - 1x daily - 4 x weekly - 1 reps  with 1# Prone Shoulder Extension - Single Arm - 1 x daily - 5-6 x weekly - 2 sets - 10 reps Standing Shoulder Flexion Wall Walk - 2 x daily - 7 x weekly - 1 sets - 10 reps Standing Shoulder Flexion AAROM with Dowel - 2 x daily - 7 x weekly - 1 sets - 10 reps Sidelying Shoulder External Rotation - 1 x daily - 5-6 x weekly - 2 sets - 10 reps Rows with scapular retraction with resistance 3x/wk, 2- 3 sets of 10 reps Shoulder extension with retraction with resistance 3x/wk, 2-3 sets of 10 reps         ASSESSMENT:   CLINICAL IMPRESSION: Pt is making great progress with R shoulder.  He is very pleased with his increased motion including UE elevation.  Pt does report cervical pain.  He responded well to to STW in cervical stating he felt better.  Pt performed shoulder exercises well including having good form with resisted standing flexion and scaption.  Pt demonstrates improved UE elevation with shelf reaches.  Pt was very appreciative of skilled PT services and had no c/o's after Rx.      Objective impairments include decreased ROM, decreased strength, hypomobility, increased edema, increased fascial restrictions,  increased muscle spasms, impaired flexibility, impaired UE functional use, improper body mechanics, postural dysfunction, and pain. These impairments are limiting patient from cleaning, community activity, driving, meal prep, occupation, laundry, yard work, and shopping.          GOALS:     SHORT TERM  GOALS:   STG Name Target Date Goal status  1 Pt will become independent with HEP in order to demonstrate synthesis of PT education.    05/16/2021 GOAL MET  2 Pt will score at least 23 pt increase on FOTO to demonstrate functional improvement in MCII and pt perceived function.     06/13/2021 GOAL MET  3 Pt will be able to demonstrate fwd flexion to 130 and ER to 30 in order to demonstrate functional improvement in UE ROM benchmark.    06/13/2021 75%  4 Pt will be able to demonstrate full symmetrical ROM in all planes in order to demonstrate functional improvement in UE function for progression to next phase of rehabilitation.     06/13/2021 PROGRESSING    LONG TERM GOALS:    LTG Name Target Date Goal status  1 Pt  will become independent with final HEP in order to demonstrate synthesis of PT education.     10/23/2021 PROGRESSING  2 Pt will score >/= 53 on FOTO to demonstrate functional improvement in R shoulder function for return to PLOF.     08/26/2021 GOAL MET  3 Pt will be able to reach [facility] and carry/hold >5 lbs in order to demonstrate functional improvement in R UE strength for return to PLOF and exercise.    08/26/2021 GOAL MET  4 Pt will be able to demonstrate full AROM in all planes in order to demonstrate functional improvement in UE function for self-care and house hold duties.    10/23/2021 PROGRESSING  5 Pt will report at least a 25% improvement in cervical pain and sx's overall. 10/23/2021  INITIAL    PLAN:  FREQUENCY:  1x/wk or once every other week  DURATION: 5 WEEKS  PLANNED INTERVENTIONS: Therapeutic exercises, Therapeutic activity, Neuro Muscular re-education,  Patient/Family education, Joint mobilization, Aquatic Therapy, Dry Needling, Electrical stimulation, Spinal mobilization, Cryotherapy, Moist heat, scar mobilization, Splintting, Taping, Vasopneumatic device, Traction, Ultrasound, Ionotophoresis 18m/ml Dexamethasone, and Manual therapy   PLAN FOR NEXT SESSION: Update HEP and establish long term HEP.  Cont per MD Reverse TSA protocol.  MD included for PT to address neck and trapezius tightness and strengthening on PT script.  Cont UT stretch and STW to UT and cervical paraspinals.  Pt sees MD in April.    RSelinda MichaelsIII PT, DPT 09/30/21 11:04 PM   PHYSICAL THERAPY DISCHARGE SUMMARY  Visits from Start of Care: 31  Current functional level related to goals / functional outcomes: See above for goal progress   Remaining deficits: See above   Education / Equipment: Pt has a HEP   Pt was seen in PT from 05/02/2021 - 09/30/2021.  He saw MD in July and note states he was doing well.  Pt also stopped by PT and let PT know that he was doing well.  His last PT note indicated he was making great progress with R shoulder.   Pt cancelled and No-showed his following PT appointments.  Unable to assess his current functional status at this time, but he has made great functional progress. He will be considered discharged at this time.   RSelinda MichaelsIII PT, DPT 04/27/22 1:49 PM

## 2021-09-30 ENCOUNTER — Encounter (HOSPITAL_BASED_OUTPATIENT_CLINIC_OR_DEPARTMENT_OTHER): Payer: Self-pay | Admitting: Physical Therapy

## 2021-09-30 ENCOUNTER — Other Ambulatory Visit: Payer: Self-pay

## 2021-09-30 ENCOUNTER — Ambulatory Visit (HOSPITAL_BASED_OUTPATIENT_CLINIC_OR_DEPARTMENT_OTHER): Payer: Medicare Other | Admitting: Physical Therapy

## 2021-09-30 DIAGNOSIS — M25611 Stiffness of right shoulder, not elsewhere classified: Secondary | ICD-10-CM | POA: Diagnosis not present

## 2021-09-30 DIAGNOSIS — M25511 Pain in right shoulder: Secondary | ICD-10-CM

## 2021-09-30 DIAGNOSIS — M6281 Muscle weakness (generalized): Secondary | ICD-10-CM

## 2021-10-02 ENCOUNTER — Encounter (HOSPITAL_BASED_OUTPATIENT_CLINIC_OR_DEPARTMENT_OTHER): Payer: BC Managed Care – PPO | Admitting: Physical Therapy

## 2021-10-07 ENCOUNTER — Ambulatory Visit (HOSPITAL_BASED_OUTPATIENT_CLINIC_OR_DEPARTMENT_OTHER): Payer: Medicare Other | Admitting: Physical Therapy

## 2021-10-09 ENCOUNTER — Encounter (HOSPITAL_BASED_OUTPATIENT_CLINIC_OR_DEPARTMENT_OTHER): Payer: BC Managed Care – PPO | Admitting: Physical Therapy

## 2021-10-14 ENCOUNTER — Ambulatory Visit (HOSPITAL_BASED_OUTPATIENT_CLINIC_OR_DEPARTMENT_OTHER): Payer: Medicare Other | Attending: Orthopaedic Surgery | Admitting: Physical Therapy

## 2021-10-14 DIAGNOSIS — M25511 Pain in right shoulder: Secondary | ICD-10-CM | POA: Insufficient documentation

## 2021-10-14 DIAGNOSIS — M6281 Muscle weakness (generalized): Secondary | ICD-10-CM | POA: Insufficient documentation

## 2021-10-14 DIAGNOSIS — M25611 Stiffness of right shoulder, not elsewhere classified: Secondary | ICD-10-CM | POA: Insufficient documentation

## 2021-10-16 ENCOUNTER — Encounter (HOSPITAL_BASED_OUTPATIENT_CLINIC_OR_DEPARTMENT_OTHER): Payer: BC Managed Care – PPO | Admitting: Physical Therapy

## 2021-10-24 ENCOUNTER — Ambulatory Visit (HOSPITAL_BASED_OUTPATIENT_CLINIC_OR_DEPARTMENT_OTHER)
Admission: RE | Admit: 2021-10-24 | Discharge: 2021-10-24 | Disposition: A | Discharge status: Home / Self Care | Payer: Medicare Other | Source: Ambulatory Visit | Attending: Orthopaedic Surgery | Admitting: Orthopaedic Surgery

## 2021-10-24 ENCOUNTER — Other Ambulatory Visit (HOSPITAL_BASED_OUTPATIENT_CLINIC_OR_DEPARTMENT_OTHER): Payer: Self-pay | Admitting: Orthopaedic Surgery

## 2021-10-24 ENCOUNTER — Ambulatory Visit (INDEPENDENT_AMBULATORY_CARE_PROVIDER_SITE_OTHER): Payer: Medicare Other | Admitting: Orthopaedic Surgery

## 2021-10-24 ENCOUNTER — Other Ambulatory Visit (HOSPITAL_BASED_OUTPATIENT_CLINIC_OR_DEPARTMENT_OTHER): Payer: Self-pay

## 2021-10-24 DIAGNOSIS — M12811 Other specific arthropathies, not elsewhere classified, right shoulder: Secondary | ICD-10-CM | POA: Diagnosis not present

## 2021-10-24 DIAGNOSIS — M25511 Pain in right shoulder: Secondary | ICD-10-CM | POA: Diagnosis not present

## 2021-10-24 DIAGNOSIS — G8929 Other chronic pain: Secondary | ICD-10-CM

## 2021-10-24 MED ORDER — METHOCARBAMOL 500 MG PO TABS
500.0000 mg | ORAL_TABLET | Freq: Two times a day (BID) | ORAL | 2 refills | Status: AC
  Filled 2021-10-24: qty 30, 15d supply, fill #0

## 2021-10-24 NOTE — Progress Notes (Signed)
? ?                            ? ? ?Post Operative Evaluation ?  ? ?Procedure/Date of Surgery: Right reverse shoulder arthroplasty performed 05/01/2019 ? ?Interval History:  ? ?Dwayne Wade presents today for 31-month follow-up following the above procedure.  Overall he is doing extremely well.  He is looking forward to possibly switching jobs at this time as his previous job requires lots of stress to his right shoulder. ? ? ?PMH/PSH/Family History/Social History/Meds/Allergies:   ? ?Past Medical History:  ?Diagnosis Date  ? Arthritis   ? Hypertension   ? Pre-diabetes   ? ?Past Surgical History:  ?Procedure Laterality Date  ? REVERSE SHOULDER ARTHROPLASTY Right 04/30/2021  ? Procedure: RIGHT REVERSE SHOULDER ARTHROPLASTY;  Surgeon: Huel Cote, MD;  Location: [facility];  Service: Orthopedics;  Laterality: Right;  ? TONSILLECTOMY  1990  ? ?Social History  ? ?Socioeconomic History  ? Marital status: Married  ?  Spouse name: Not on file  ? Number of children: Not on file  ? Years of education: Not on file  ? Highest education level: Not on file  ?Occupational History  ? Not on file  ?Tobacco Use  ? Smoking status: Never  ? Smokeless tobacco: Never  ?Vaping Use  ? Vaping Use: Never used  ?Substance and Sexual Activity  ? Alcohol use: Not Currently  ? Drug use: Never  ? Sexual activity: Yes  ?  Birth control/protection: None  ?Other Topics Concern  ? Not on file  ?Social History Narrative  ? Not on file  ? ?Social Determinants of Health  ? ?Financial Resource Strain: Not on file  ?Food Insecurity: Not on file  ?Transportation Needs: Not on file  ?Physical Activity: Not on file  ?Stress: Not on file  ?Social Connections: Not on file  ? ?No family history on file. ?Allergies  ?Allergen Reactions  ? Ibuprofen Nausea Only  ? ?Current Outpatient Medications  ?Medication Sig Dispense Refill  ? methocarbamol (ROBAXIN) 500 MG tablet Take 1 tablet (500 mg total) by mouth in the morning and at bedtime. 30 tablet 2  ? amLODipine (NORVASC)  5 MG tablet Take 5 mg by mouth in the morning.    ? aspirin EC 325 MG tablet Take 1 tablet (325 mg total) by mouth daily. 30 tablet 0  ? Cholecalciferol (VITAMIN D3) 50 MCG (2000 UT) TABS Take 2,000 Units by mouth every evening.    ? Dapagliflozin Propanediol (FARXIGA PO) Take by mouth.    ? DEXILANT 60 MG capsule Take 60 mg by mouth every evening.    ? hydrOXYzine (ATARAX/VISTARIL) 25 MG tablet Take 25 mg by mouth 4 (four) times daily as needed for anxiety.    ? losartan-hydrochlorothiazide (HYZAAR) 100-25 MG tablet Take 1 tablet by mouth every evening.    ? magnesium oxide (MAG-OX) 400 MG tablet Take 400 mg by mouth every evening.    ? meclizine (ANTIVERT) 25 MG tablet Take 25 mg by mouth every 8 (eight) hours as needed for dizziness.    ? montelukast (SINGULAIR) 10 MG tablet Take 10 mg by mouth daily as needed (allergies/respiratory issues).    ? Multiple Vitamin (MULTIVITAMIN WITH MINERALS) TABS tablet Take 1 tablet by mouth every evening.    ? oxycodone (OXY-IR) 5 MG capsule Take 1 capsule (5 mg total) by mouth every 4 (four) hours as needed (severe pain). 20 capsule 0  ? oxyCODONE (ROXICODONE)  15 MG immediate release tablet Take 15 mg by mouth every 4 (four) hours as needed for pain.    ? PROLATE 10-300 MG tablet Take 1 tablet by mouth in the morning, at noon, and at bedtime.    ? simvastatin (ZOCOR) 40 MG tablet Take 40 mg by mouth at bedtime.    ? traMADol (ULTRAM) 50 MG tablet Take 50 mg by mouth in the morning, at noon, in the evening, and at bedtime.    ? Vitamin E 450 MG (1000 UT) CAPS Take 1,000 Units by mouth every evening.    ? zolpidem (AMBIEN) 10 MG tablet Take 10 mg by mouth at bedtime.    ? ?Current Facility-Administered Medications  ?Medication Dose Route Frequency Provider Last Rate Last Admin  ? oxyCODONE (Oxy IR/ROXICODONE) immediate release tablet 5 mg  5 mg Oral Q4H PRN Huel Cote, MD      ? ?No results found. ? ?Review of Systems:   ?A ROS was performed including pertinent positives  and negatives as documented in the HPI. ? ? ?Musculoskeletal Exam:   ? ?There were no vitals taken for this visit. ?Active range of motion is 120 degrees forward elevation external rotation at the side is to 45 degrees.  Sensation is intact in all distributions of the right arm.  He is able to flex and extend at the right elbow ? ?Imaging:   ? ?Right shoulder 3 views: ?Reverse shoulder arthroplasty is in good alignment without evidence of hardware failure ? ?I personally reviewed and interpreted the radiographs. ? ? ?Assessment:   ?71 year old male with right reverse shoulder arthroplasty 6 months prior.  Overall he is doing extremely well.  We will continue to advance as tolerated.  I will see him back in 3 months for reassessment ? ?Plan :   ? ?-Return to clinic in 3 months ? ? ?I personally saw and evaluated the patient, and participated in the management and treatment plan. ? ?Huel Cote, MD ?Attending Physician, Orthopedic Surgery ? ?This document was dictated using Conservation officer, historic buildings. A reasonable attempt at proof reading has been made to minimize errors. ? ?

## 2021-10-27 ENCOUNTER — Telehealth: Payer: Self-pay | Admitting: Orthopaedic Surgery

## 2021-10-27 NOTE — Telephone Encounter (Signed)
Please provide note with patients work status. Thanks ?

## 2021-10-29 ENCOUNTER — Encounter (HOSPITAL_BASED_OUTPATIENT_CLINIC_OR_DEPARTMENT_OTHER): Payer: Self-pay | Admitting: Orthopaedic Surgery

## 2021-10-29 NOTE — Telephone Encounter (Signed)
Noted for forms ?

## 2022-01-21 ENCOUNTER — Ambulatory Visit (INDEPENDENT_AMBULATORY_CARE_PROVIDER_SITE_OTHER): Payer: Medicare Other | Admitting: Orthopaedic Surgery

## 2022-01-21 DIAGNOSIS — M12811 Other specific arthropathies, not elsewhere classified, right shoulder: Secondary | ICD-10-CM

## 2022-01-21 NOTE — Progress Notes (Signed)
Post Operative Evaluation    Procedure/Date of Surgery: Right reverse shoulder arthroplasty performed 05/01/2019  Interval History:   Dwayne Wade presents today for follow-up of his right reverse shoulder arthroplasty.  Overall he is doing extremely well.  He has no pain at today's visit.  He is extremely pleased with the quality complaint  PMH/PSH/Family History/Social History/Meds/Allergies:    Past Medical History:  Diagnosis Date   Arthritis    Hypertension    Pre-diabetes    Past Surgical History:  Procedure Laterality Date   REVERSE SHOULDER ARTHROPLASTY Right 04/30/2021   Procedure: RIGHT REVERSE SHOULDER ARTHROPLASTY;  Surgeon: Huel Cote, MD;  Location: [facility];  Service: Orthopedics;  Laterality: Right;   TONSILLECTOMY  1990   Social History   Socioeconomic History   Marital status: Married    Spouse name: Not on file   Number of children: Not on file   Years of education: Not on file   Highest education level: Not on file  Occupational History   Not on file  Tobacco Use   Smoking status: Never   Smokeless tobacco: Never  Vaping Use   Vaping Use: Never used  Substance and Sexual Activity   Alcohol use: Not Currently   Drug use: Never   Sexual activity: Yes    Birth control/protection: None  Other Topics Concern   Not on file  Social History Narrative   Not on file   Social Determinants of Health   Financial Resource Strain: Not on file  Food Insecurity: Not on file  Transportation Needs: Not on file  Physical Activity: Not on file  Stress: Not on file  Social Connections: Not on file   No family history on file. Allergies  Allergen Reactions   Ibuprofen Nausea Only   Current Outpatient Medications  Medication Sig Dispense Refill   amLODipine (NORVASC) 5 MG tablet Take 5 mg by mouth in the morning.     aspirin EC 325 MG tablet Take 1 tablet (325 mg total) by mouth daily. 30 tablet 0   Cholecalciferol (VITAMIN  D3) 50 MCG (2000 UT) TABS Take 2,000 Units by mouth every evening.     Dapagliflozin Propanediol (FARXIGA PO) Take by mouth.     DEXILANT 60 MG capsule Take 60 mg by mouth every evening.     hydrOXYzine (ATARAX/VISTARIL) 25 MG tablet Take 25 mg by mouth 4 (four) times daily as needed for anxiety.     losartan-hydrochlorothiazide (HYZAAR) 100-25 MG tablet Take 1 tablet by mouth every evening.     magnesium oxide (MAG-OX) 400 MG tablet Take 400 mg by mouth every evening.     meclizine (ANTIVERT) 25 MG tablet Take 25 mg by mouth every 8 (eight) hours as needed for dizziness.     methocarbamol (ROBAXIN) 500 MG tablet Take 1 tablet (500 mg total) by mouth in the morning and at bedtime. 30 tablet 2   montelukast (SINGULAIR) 10 MG tablet Take 10 mg by mouth daily as needed (allergies/respiratory issues).     Multiple Vitamin (MULTIVITAMIN WITH MINERALS) TABS tablet Take 1 tablet by mouth every evening.     oxycodone (OXY-IR) 5 MG capsule Take 1 capsule (5 mg total) by mouth every 4 (four) hours as needed (severe pain). 20 capsule 0   oxyCODONE (ROXICODONE) 15 MG immediate release tablet Take 15  mg by mouth every 4 (four) hours as needed for pain.     PROLATE 10-300 MG tablet Take 1 tablet by mouth in the morning, at noon, and at bedtime.     simvastatin (ZOCOR) 40 MG tablet Take 40 mg by mouth at bedtime.     traMADol (ULTRAM) 50 MG tablet Take 50 mg by mouth in the morning, at noon, in the evening, and at bedtime.     Vitamin E 450 MG (1000 UT) CAPS Take 1,000 Units by mouth every evening.     zolpidem (AMBIEN) 10 MG tablet Take 10 mg by mouth at bedtime.     Current Facility-Administered Medications  Medication Dose Route Frequency Provider Last Rate Last Admin   oxyCODONE (Oxy IR/ROXICODONE) immediate release tablet 5 mg  5 mg Oral Q4H PRN Huel Cote, MD       No results found.  Review of Systems:   A ROS was performed including pertinent positives and negatives as documented in the  HPI.   Musculoskeletal Exam:    There were no vitals taken for this visit. Active range of motion is 120 degrees forward elevation external rotation at the side is to 45 degrees.  Sensation is intact in all distributions of the right arm.  He is able to flex and extend at the right elbow  Imaging:    Right shoulder 3 views: Reverse shoulder arthroplasty is in good alignment without evidence of hardware failure  I personally reviewed and interpreted the radiographs.   Assessment:   71 year old male with right reverse shoulder arthroplasty overall doing extremely well.  He will follow-up as needed  Plan :    -Return to clinic as needed   I personally saw and evaluated the patient, and participated in the management and treatment plan.  Huel Cote, MD Attending Physician, Orthopedic Surgery  This document was dictated using Dragon voice recognition software. A reasonable attempt at proof reading has been made to minimize errors.

## 2022-01-23 ENCOUNTER — Ambulatory Visit (HOSPITAL_BASED_OUTPATIENT_CLINIC_OR_DEPARTMENT_OTHER): Payer: Medicare Other | Admitting: Orthopaedic Surgery

## 2022-01-26 ENCOUNTER — Ambulatory Visit (HOSPITAL_BASED_OUTPATIENT_CLINIC_OR_DEPARTMENT_OTHER): Payer: Medicare Other | Admitting: Orthopaedic Surgery

## 2023-07-10 IMAGING — DX DG SHOULDER 2+V*R*
3 series · 4 of 4 positions shown · non-contrast
Comparison: None.

CLINICAL DATA: Postop right shoulder surgery

EXAM:
RIGHT SHOULDER - 2+ VIEW

[shoulder grashey]
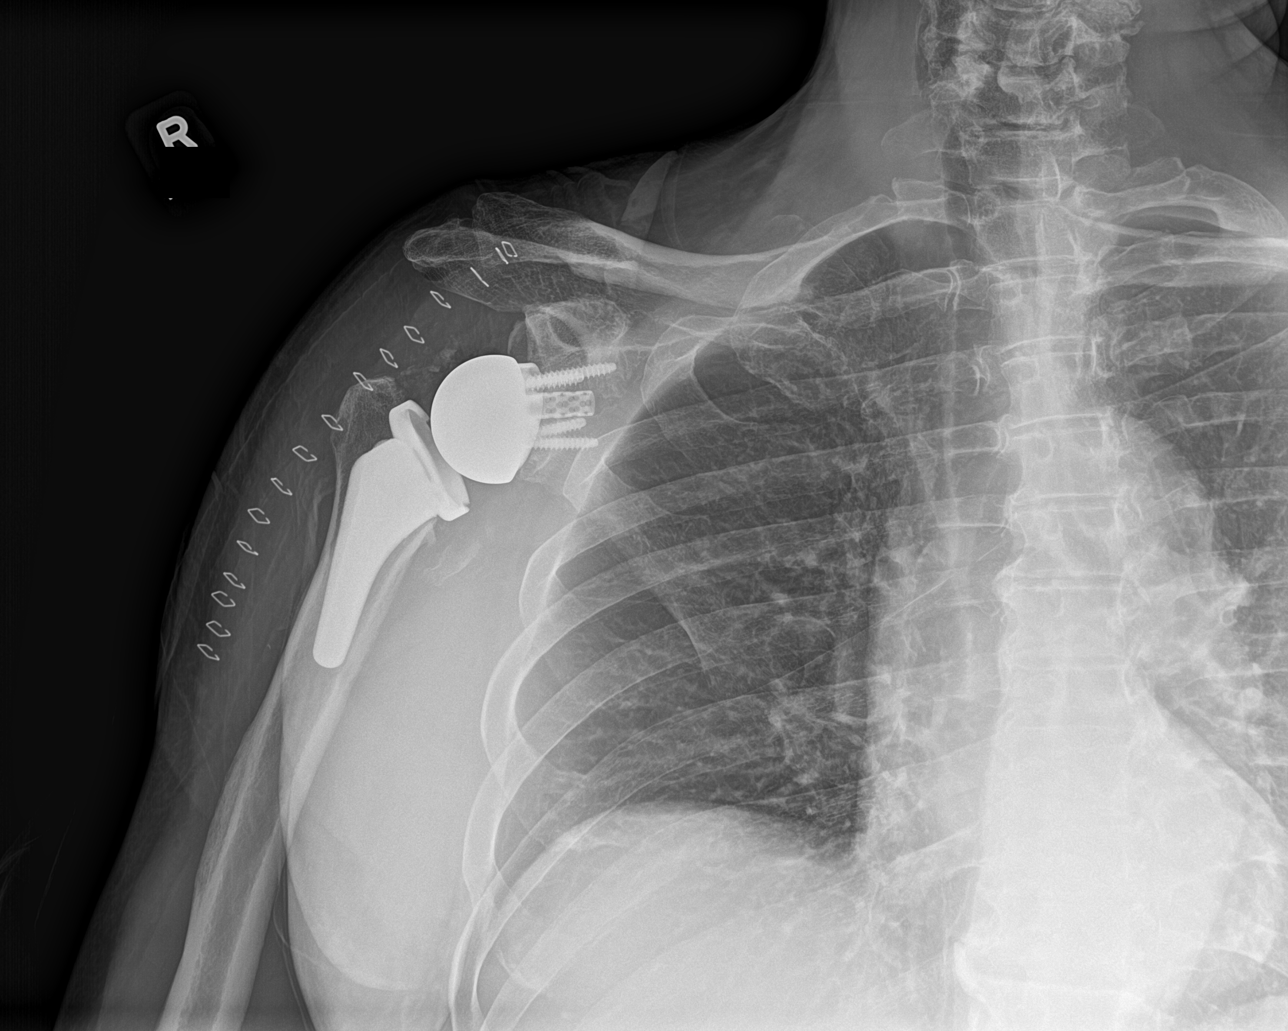

[shoulder y view]
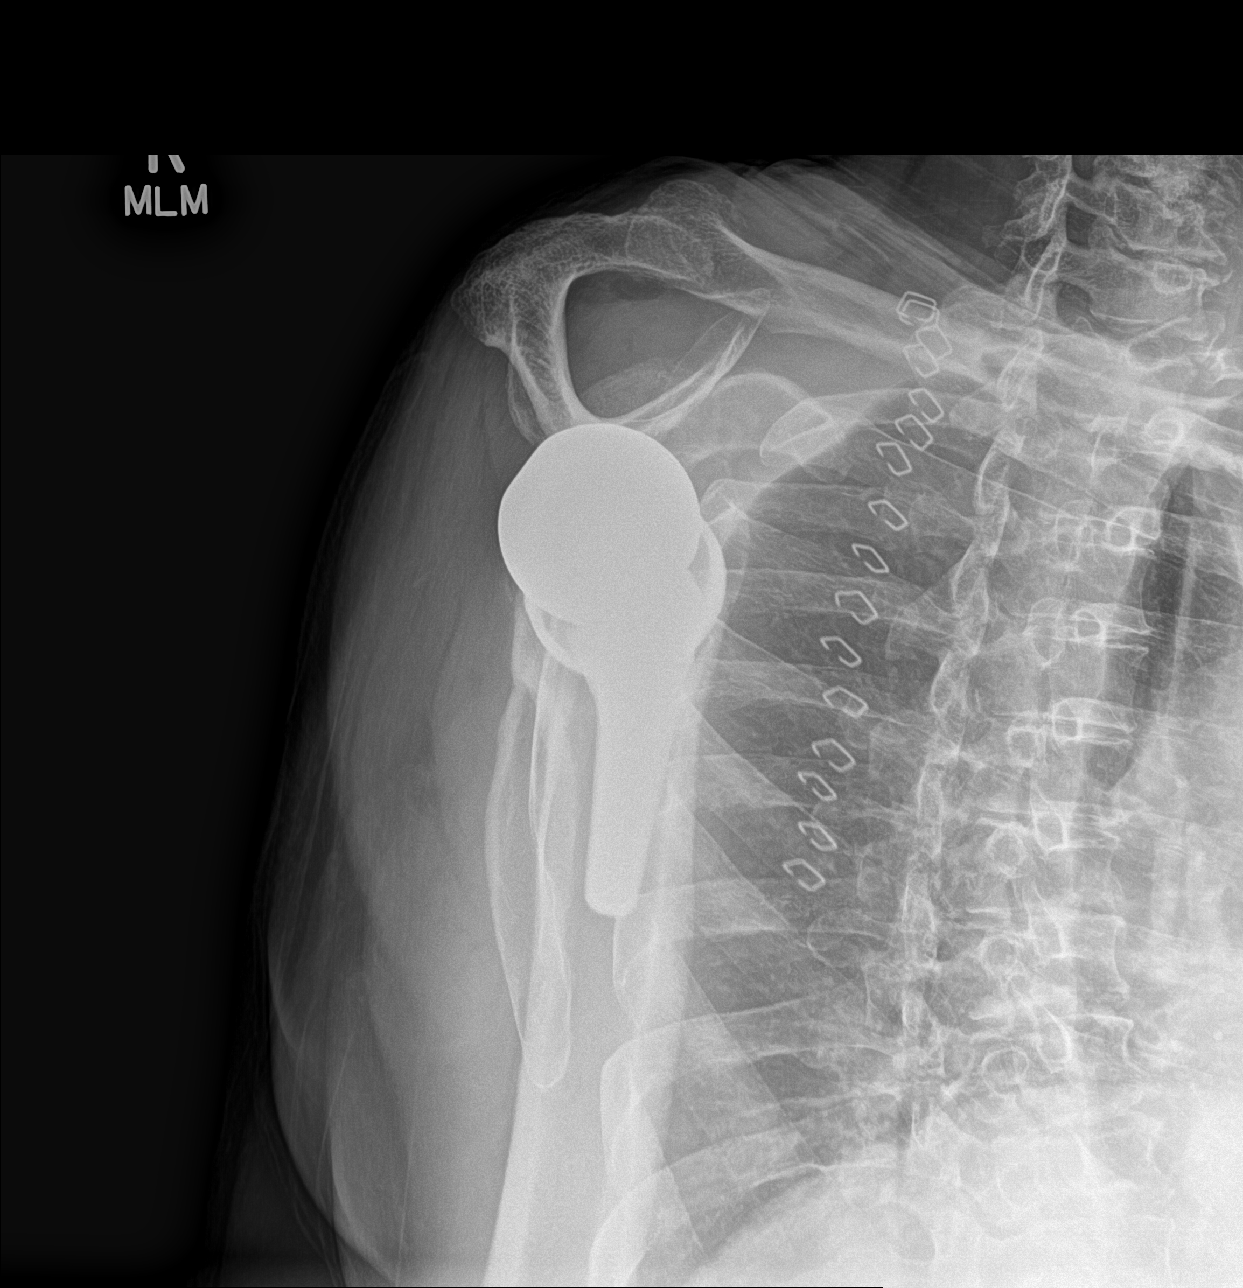

[Series 3: shoulder axillary · 0.14mm/px · 2 of 2 slices shown]
[im 1/2]
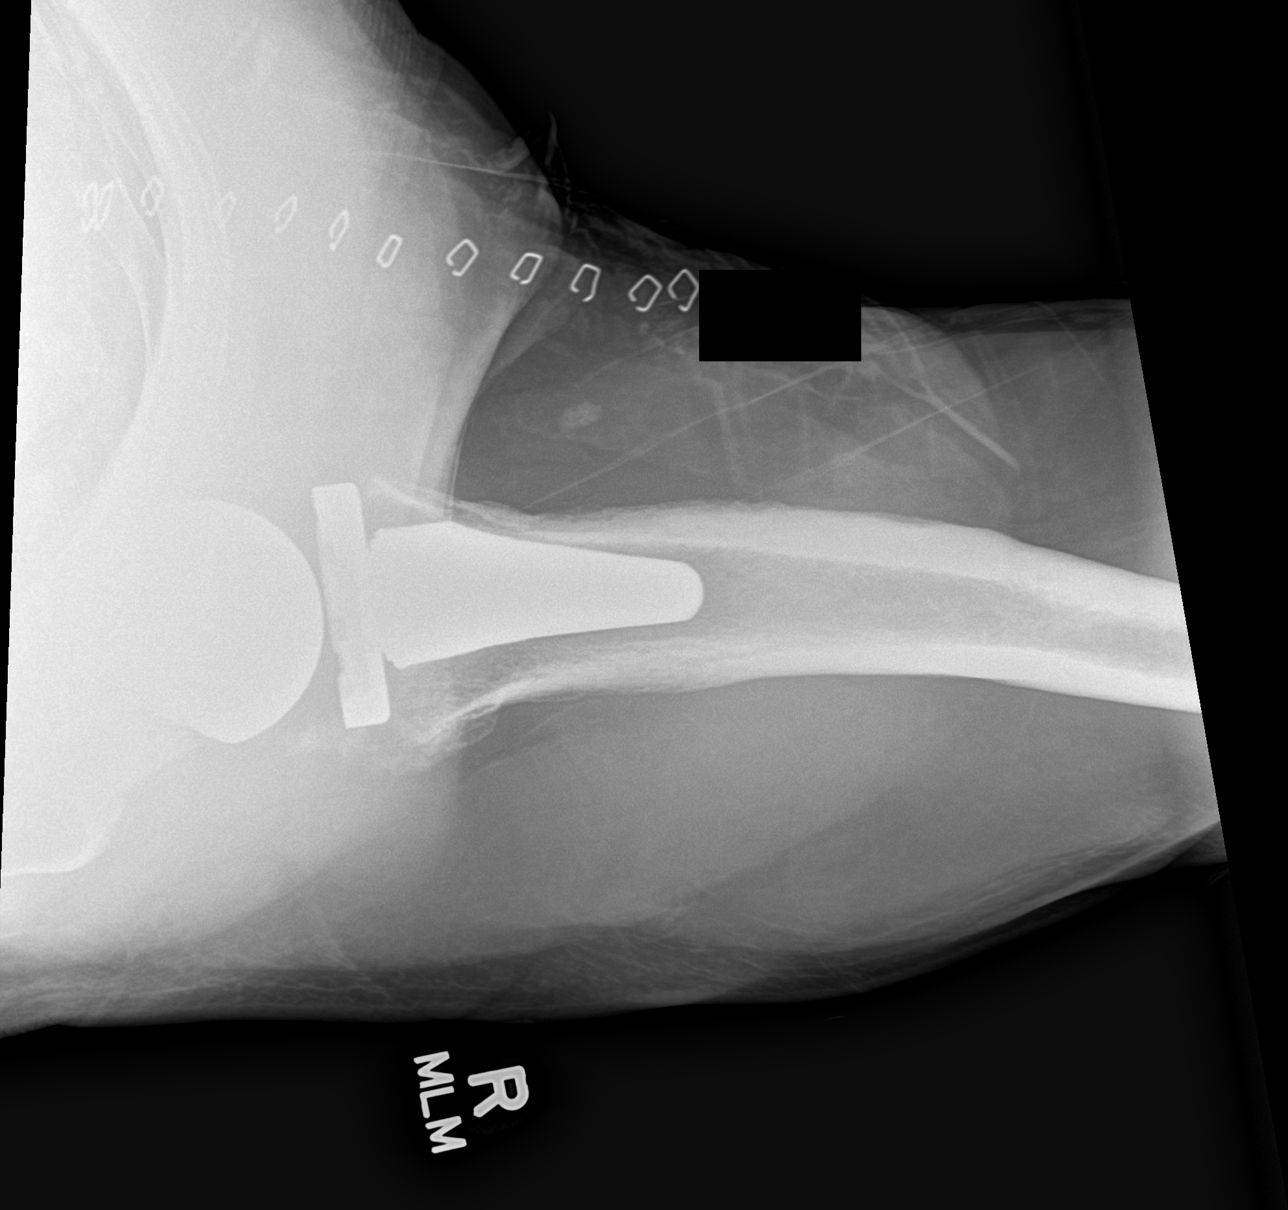
[im 2/2]
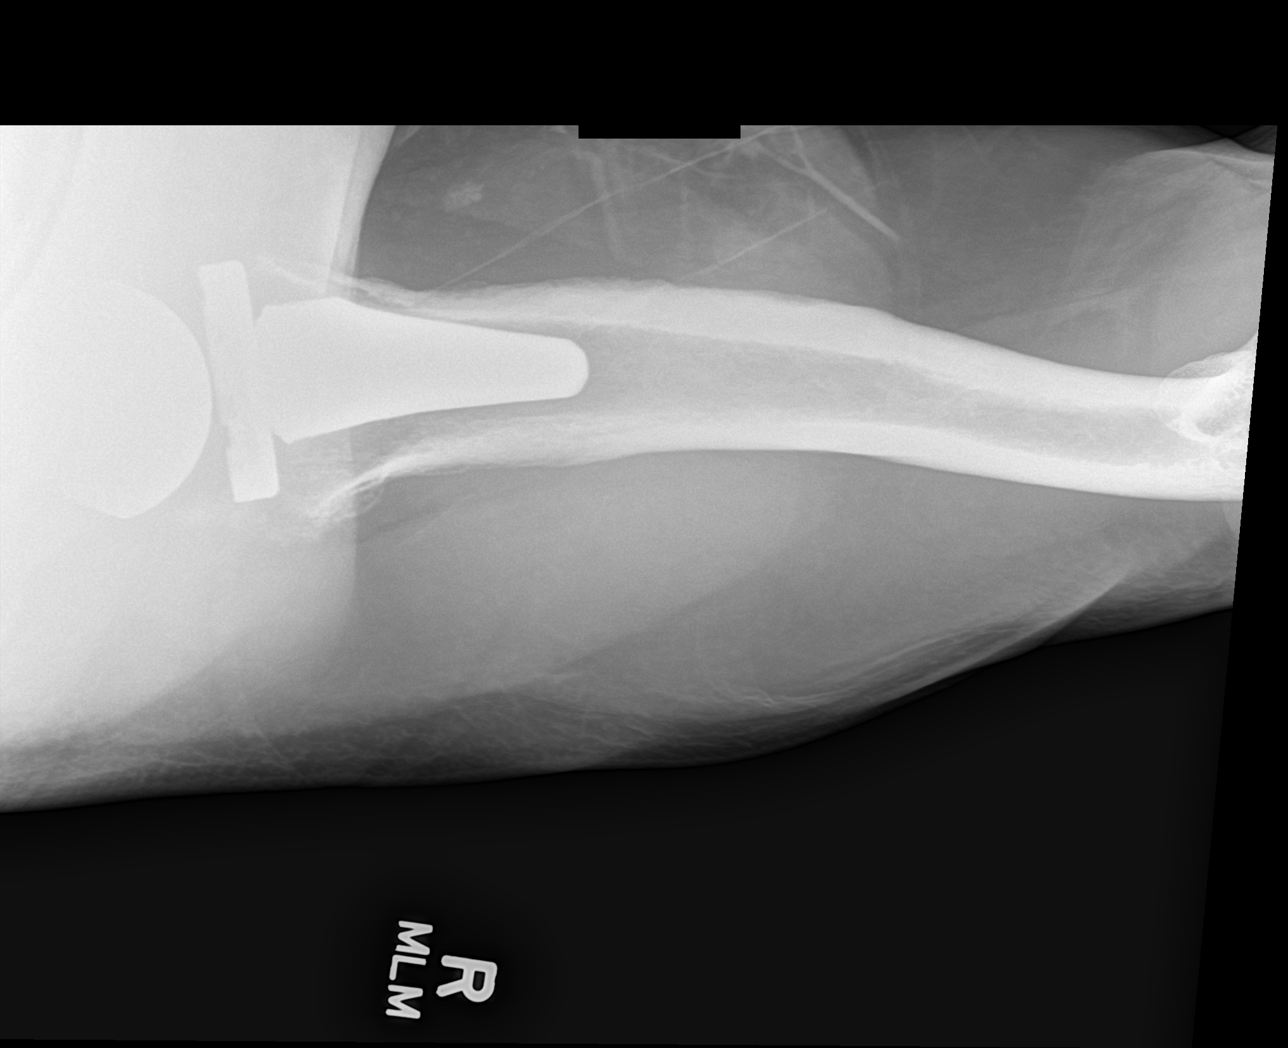

[4 of 4 positions shown; findings below may reference images not displayed]

FINDINGS: Status post total reversed right shoulder arthroplasty. No
radiographic findings suggest post surgical complication. There is
no evidence of fracture or dislocation. There is no evidence of
arthropathy or other focal bone abnormality. Subcutaneus soft tissue
edema consistent with postsurgical changes. Skin staples overlie
the right shoulder.
IMPRESSION: Status post total reversed right shoulder arthroplasty.
